# Patient Record
Sex: Female | Born: 1990 | Hispanic: Yes | Marital: Single | Smoking: Never smoker
Health system: Southern US, Community
[De-identification: ages and names within clinical notes are randomized; demographics above are authoritative.]

## PROBLEM LIST (undated history)

## (undated) DIAGNOSIS — Z789 Other specified health status: Secondary | ICD-10-CM

## (undated) DIAGNOSIS — O039 Complete or unspecified spontaneous abortion without complication: Secondary | ICD-10-CM

## (undated) DIAGNOSIS — F32A Depression, unspecified: Secondary | ICD-10-CM

## (undated) HISTORY — DX: Complete or unspecified spontaneous abortion without complication: O03.9

---

## 2021-03-31 ENCOUNTER — Other Ambulatory Visit: Payer: Self-pay

## 2021-03-31 ENCOUNTER — Emergency Department (HOSPITAL_COMMUNITY)
Admission: EM | Admit: 2021-03-31 | Discharge: 2021-03-31 | Disposition: A | Discharge status: Home / Self Care | Payer: Self-pay | Attending: Emergency Medicine | Admitting: Emergency Medicine

## 2021-03-31 ENCOUNTER — Emergency Department (HOSPITAL_COMMUNITY): Payer: Self-pay

## 2021-03-31 DIAGNOSIS — Z3A01 Less than 8 weeks gestation of pregnancy: Secondary | ICD-10-CM | POA: Insufficient documentation

## 2021-03-31 DIAGNOSIS — O26851 Spotting complicating pregnancy, first trimester: Secondary | ICD-10-CM | POA: Insufficient documentation

## 2021-03-31 DIAGNOSIS — N939 Abnormal uterine and vaginal bleeding, unspecified: Secondary | ICD-10-CM

## 2021-03-31 LAB — CBC WITH DIFFERENTIAL/PLATELET
Abs Immature Granulocytes: 0.03 10*3/uL (ref 0.00–0.07)
Basophils Absolute: 0 10*3/uL (ref 0.0–0.1)
Basophils Relative: 0 %
Eosinophils Absolute: 0.1 10*3/uL (ref 0.0–0.5)
Eosinophils Relative: 1 %
HCT: 38.5 % (ref 36.0–46.0)
Hemoglobin: 13 g/dL (ref 12.0–15.0)
Immature Granulocytes: 0 %
Lymphocytes Relative: 24 %
Lymphs Abs: 2.3 10*3/uL (ref 0.7–4.0)
MCH: 31.7 pg (ref 26.0–34.0)
MCHC: 33.8 g/dL (ref 30.0–36.0)
MCV: 93.9 fL (ref 80.0–100.0)
Monocytes Absolute: 0.6 10*3/uL (ref 0.1–1.0)
Monocytes Relative: 6 %
Neutro Abs: 6.4 10*3/uL (ref 1.7–7.7)
Neutrophils Relative %: 69 %
Platelets: 299 10*3/uL (ref 150–400)
RBC: 4.1 MIL/uL (ref 3.87–5.11)
RDW: 12.3 % (ref 11.5–15.5)
WBC: 9.5 10*3/uL (ref 4.0–10.5)
nRBC: 0 % (ref 0.0–0.2)

## 2021-03-31 LAB — ABO/RH: ABO/RH(D): A POS

## 2021-03-31 LAB — HCG, QUANTITATIVE, PREGNANCY: hCG, Beta Chain, Quant, S: 11 m[IU]/mL — ABNORMAL HIGH (ref <5)

## 2021-03-31 LAB — COMPREHENSIVE METABOLIC PANEL
ALT: 13 U/L (ref 0–44)
AST: 15 U/L (ref 15–41)
Albumin: 3.6 g/dL (ref 3.5–5.0)
Alkaline Phosphatase: 51 U/L (ref 38–126)
Anion gap: 6 (ref 5–15)
BUN: 7 mg/dL (ref 6–20)
CO2: 24 mmol/L (ref 22–32)
Calcium: 8.5 mg/dL — ABNORMAL LOW (ref 8.9–10.3)
Chloride: 105 mmol/L (ref 98–111)
Creatinine, Ser: 0.55 mg/dL (ref 0.44–1.00)
GFR, Estimated: >60 mL/min (ref >60)
Glucose, Bld: 92 mg/dL (ref 70–99)
Potassium: 3.6 mmol/L (ref 3.5–5.1)
Sodium: 135 mmol/L (ref 135–145)
Total Bilirubin: 0.6 mg/dL (ref 0.3–1.2)
Total Protein: 6.6 g/dL (ref 6.5–8.1)

## 2021-03-31 LAB — WET PREP, GENITAL
Clue Cells Wet Prep HPF POC: NONE SEEN
Sperm: NONE SEEN
Trich, Wet Prep: NONE SEEN
WBC, Wet Prep HPF POC: NONE SEEN
Yeast Wet Prep HPF POC: NONE SEEN

## 2021-03-31 LAB — POC URINE PREG, ED: Preg Test, Ur: NEGATIVE

## 2021-03-31 NOTE — ED Provider Notes (Signed)
Patient signed out to me by previous provider.  Please refer to their note for full HPI.  Briefly this is a 30 year old female who presents with vaginal bleeding in pregnancy.  Patient's urine pregnancy test is negative but her hCG quant is positive around 11.  Pelvic done by previous provider did show small constant trickle of blood from the cervical os, no tissue.  We are pending ultrasound and OB/GYN consultation.  Patient denies ongoing pelvic pain. Physical Exam  BP 115/68   Pulse 66   Temp 98.9 F (37.2 C) (Oral)   Resp 12   LMP 02/25/2021 (Exact Date)   SpO2 100%   Physical Exam Vitals and nursing note reviewed.  Constitutional:      Appearance: Normal appearance.  HENT:     Head: Normocephalic.     Mouth/Throat:     Mouth: Mucous membranes are moist.  Cardiovascular:     Rate and Rhythm: Normal rate.  Pulmonary:     Effort: Pulmonary effort is normal. No respiratory distress.  Abdominal:     Palpations: Abdomen is soft.     Tenderness: There is no abdominal tenderness.  Skin:    General: Skin is warm.  Neurological:     Mental Status: She is alert and oriented to person, place, and time. Mental status is at baseline.  Psychiatric:        Mood and Affect: Mood normal.    ED Course/Procedures     Procedures  MDM    Ultrasound does not identify an intrauterine gestation.  This could be because the pregnancy is too early, its in an visualized ectopic or miscarriage.  Spoke with on-call OB/GYN Dr. Vergie Living who recommends repeat hCG in the office in 48 hours.  He states his office will call the patient tomorrow to schedule this for Thursday.  Blood type is a positive, no need for RhoGAM.  Results and expectations were discussed thoroughly with interpreter.  Patient continues to have no abdominal/pelvic pain.  Appropriate for outpatient follow-up with OB/GYN.  Patient will be discharged and treated as an outpatient.  Discharge plan and strict return to ED precautions  discussed, patient verbalizes understanding and agreement.      Rozelle Logan, DO 03/31/21 1826

## 2021-03-31 NOTE — ED Triage Notes (Signed)
Pt reports vaginal bleeding this morning with lower back and abdominal pain. Positive home pregnancy test.

## 2021-03-31 NOTE — ED Provider Notes (Signed)
[facility] EMERGENCY DEPARTMENT Provider Note   CSN: [id number] Arrival date & time: 03/31/21  3474     History Chief Complaint  Patient presents with   Vaginal Bleeding    Elaine Douglas is a 30 y.o. female.  The history is provided by the patient. A language interpreter was used.  Vaginal Bleeding Elaine Douglas is a 30 y.o. female who presents to the Emergency Department complaining of bleeding. She presents the emergency department complaining of agile bleeding that started this morning. She states that it was initially very little but it has been increasing during her ED stay. She did have a positive pregnancy test at home eight days ago as well as three days ago. LMP was June 1. She is a G4 [id number]. Has no known medical problems.    No past medical history on file.  There are no problems to display for this patient.      OB History   No obstetric history on file.     No family history on file.     Home Medications Prior to Admission medications   Not on File    Allergies    Patient has no known allergies.  Review of Systems   Review of Systems  Genitourinary:  Positive for vaginal bleeding.  All other systems reviewed and are negative.  Physical Exam Updated Vital Signs BP 115/68   Pulse 66   Temp 98.9 F (37.2 C) (Oral)   Resp 12   LMP 02/25/2021 (Exact Date)   SpO2 100%   Physical Exam Vitals and nursing note reviewed.  Constitutional:      Appearance: She is well-developed.  HENT:     Head: Normocephalic and atraumatic.  Cardiovascular:     Rate and Rhythm: Normal rate and regular rhythm.  Pulmonary:     Effort: Pulmonary effort is normal. No respiratory distress.  Abdominal:     Palpations: Abdomen is soft.     Tenderness: There is no abdominal tenderness. There is no guarding or rebound.  Genitourinary:    Comments: Moderate amount of vaginal bleeding with small clots in vaginal vault.  No CMT.  Only  partially able to visualize os due to retroverted uterus.  No clear FB or tissue in os.  Small amount of recollection of blood after clearing.  Musculoskeletal:        General: No tenderness.  Skin:    General: Skin is warm and dry.  Neurological:     Mental Status: She is alert and oriented to person, place, and time.  Psychiatric:        Behavior: Behavior normal.    ED Results / Procedures / Treatments   Labs (all labs ordered are listed, but only abnormal results are displayed) Labs Reviewed  COMPREHENSIVE METABOLIC PANEL - Abnormal; Notable for the following components:      Result Value   Calcium 8.5 (*)    All other components within normal limits  HCG, QUANTITATIVE, PREGNANCY - Abnormal; Notable for the following components:   hCG, Beta Chain, Quant, S 11 (*)    All other components within normal limits  WET PREP, GENITAL  CBC WITH DIFFERENTIAL/PLATELET  POC URINE PREG, ED  ABO/RH  GC/CHLAMYDIA PROBE AMP ([place]) NOT AT [facility]    EKG None  Radiology No results found.  Procedures Procedures   Medications Ordered in ED Medications - No data to display  ED Course  I have reviewed the triage vital signs and the nursing  notes.  Pertinent labs & imaging results that were available during my care of the patient were reviewed by me and considered in my medical decision making (see chart for details).    MDM Rules/Calculators/A&P                         patient here for evaluation of vaginal bleeding that started today. She had two positive home pregnancy tests over the last week. Her quant is minimally elevated at 11 today. Pelvic examination with small amount of bleeding. Concern for miscarriage. Plan to obtain ultrasound to further evaluate. Patient care transferred pending ultrasound.  Final Clinical Impression(s) / ED Diagnoses Final diagnoses:  Vaginal bleeding    Rx / DC Orders ED Discharge Orders     None        Tilden Fossa, MD 03/31/21  1526

## 2021-03-31 NOTE — Discharge Instructions (Addendum)
You have been seen and discharged from the emergency department.  Your pregnancy hormone was slightly elevated and your blood, this may indicate very early pregnancy.  The ultrasound did not show an intrauterine pregnancy however it is very early on.  This could also be a miscarriage.  The OB/GYN office will call you tomorrow to schedule an appointment on Thursday, 04/02/2021 for repeat blood work and imaging.  It is extremely important that you follow-up with this appointment.  Rest over the next couple days, stay well-hydrated, avoid strenuous activity.  If you have any worsening symptoms, profuse vaginal bleeding, severe lower abdominal/pelvic pain or further concerns for your health please return to an emergency department for further evaluation.

## 2021-03-31 NOTE — ED Notes (Signed)
Patient discharge instructions reviewed with the patient. The patient verbalized understanding of instructions. Patient discharged. 

## 2021-03-31 NOTE — ED Provider Notes (Signed)
Emergency Medicine Provider Triage Evaluation Note  Elaine Douglas 30 y.o. female was evaluated in triage.  Pt complains of vaginal bleeding, abdominal cramping that began today.  Patient reports her LMP was 02/25/2021.  She she has taken at home pregnancy test that were positive.  She states she has had previous miscarriages before.  She reports some associated abdominal cramping.  No fevers.   Review of Systems  Positive: Vaginal bleeding, abdominal cramping. Negative: Fevers.  Physical Exam  BP 134/82   Pulse 70   Temp 98.2 F (36.8 C) (Oral)   Resp 18   Ht 5\' 4"  (1.626 m)   Wt 65.8 kg   SpO2 100%   BMI 24.89 kg/m  Gen:   Awake, no distress   HEENT:  Atraumatic  Resp:  Normal effort  Cardiac:  Normal rate  Abd:   Nondistended, nontender  MSK:   Moves extremities without difficulty  Neuro:  Speech clear   Other:      Medical Decision Making  Medically screening exam initiated at 10:54 AM  Appropriate orders placed.  was informed that the remainder of the evaluation will be completed by another provider, this initial triage assessment does not replace that evaluation. They are counseled that they will need to remain in the ED until the completion of their workup, including full H&P and results of any tests.  Risks of leaving the emergency department prior to completion of treatment were discussed. Patient was advised to inform ED staff if they are leaving before their treatment is complete. The patient acknowledged these risks and time was allowed for questions.     The patient appears stable so that the remainder of the MSE may be completed by another provider.    Clinical Impression  Vaginal bleeding   Portions of this note were generated with Dragon dictation software. Dictation errors may occur despite best attempts at proofreading.     Elaine Squires, PA-C 03/31/21 1055    06/01/21, MD 04/01/21 [id number]

## 2021-03-31 NOTE — ED Notes (Signed)
Patient transported to Ultrasound 

## 2021-04-01 LAB — GC/CHLAMYDIA PROBE AMP (~~LOC~~) NOT AT ARMC
Chlamydia: NEGATIVE
Comment: NEGATIVE
Comment: NORMAL
Neisseria Gonorrhea: NEGATIVE

## 2021-04-03 ENCOUNTER — Telehealth: Payer: Self-pay

## 2021-04-03 DIAGNOSIS — O3680X0 Pregnancy with inconclusive fetal viability, not applicable or unspecified: Secondary | ICD-10-CM

## 2021-04-03 NOTE — Telephone Encounter (Addendum)
-----   Message from Lovilia Bing, MD sent at 04/03/2021 11:41 AM EDT ----- Regarding: please set her up with a non stat beta visit for early next week. thanks  Called pt with [employer] # [phone] and informed her that the provider would like for her to come in next Tuesday for a pregnancy hormone level.    Pt stated that she will be able to come in on 04/07/21 @ 0900 for non stat beta. Office location info provided to the patient.   Leonette Nutting  04/03/21

## 2021-04-07 ENCOUNTER — Other Ambulatory Visit: Payer: Self-pay

## 2021-04-07 ENCOUNTER — Ambulatory Visit: Payer: Self-pay

## 2021-04-07 DIAGNOSIS — O3680X0 Pregnancy with inconclusive fetal viability, not applicable or unspecified: Secondary | ICD-10-CM

## 2021-04-08 ENCOUNTER — Telehealth: Payer: Self-pay

## 2021-04-08 LAB — BETA HCG QUANT (REF LAB): hCG Quant: <1 m[IU]/mL

## 2021-04-08 NOTE — Telephone Encounter (Addendum)
-----   Message from Oktaha Bing, MD sent at 04/08/2021  8:03 AM EDT ----- Can y'all let her know that her beta is back to zero and she should expect a period sometime in the next month? Thanks  Called pt with [employer] and informed pt provider's recommendation and results.  Pt verbalized understanding with no further questions.   Addison Naegeli, RN  04/08/21

## 2021-09-16 ENCOUNTER — Other Ambulatory Visit: Payer: Self-pay

## 2021-09-16 ENCOUNTER — Ambulatory Visit (INDEPENDENT_AMBULATORY_CARE_PROVIDER_SITE_OTHER): Payer: Self-pay | Admitting: Family Medicine

## 2021-09-16 VITALS — BP 116/70 | HR 70 | Wt 162.3 lb

## 2021-09-16 DIAGNOSIS — Z3201 Encounter for pregnancy test, result positive: Secondary | ICD-10-CM

## 2021-09-16 DIAGNOSIS — Z3200 Encounter for pregnancy test, result unknown: Secondary | ICD-10-CM

## 2021-09-16 LAB — POCT PREGNANCY, URINE: Preg Test, Ur: POSITIVE — AB

## 2021-09-16 NOTE — Patient Instructions (Signed)

## 2021-09-16 NOTE — Progress Notes (Signed)
°  History:  [name] is a 30 y.o. G1P0 who presents to clinic today with complaint of possible pregnancy.   Had positive pregnancy test Regular period prior to conceiving No other meds or medical conditions Very nervous due to hx of two losses  History reviewed. No pertinent past medical history.  History reviewed. No pertinent surgical history.  The following portions of the patient's history were reviewed and updated as appropriate: allergies, current medications, past family history, past medical history, past social history, past surgical history and problem list.   Review of Systems:  Pertinent items noted in HPI and remainder of comprehensive ROS otherwise negative.  Objective:  Physical Exam BP 116/70    Pulse 70    Wt 162 lb 4.8 oz (73.6 kg)    LMP 08/14/2021  Physical Exam Vitals reviewed.  Constitutional:      General: She is not in acute distress.    Appearance: She is well-developed. She is not diaphoretic.  Eyes:     General: No scleral icterus. Pulmonary:     Effort: Pulmonary effort is normal. No respiratory distress.  Skin:    General: Skin is warm and dry.  Neurological:     Mental Status: She is alert.     Coordination: Coordination normal.     Labs and Imaging Results for orders placed or performed in visit on 09/16/21 (from the past 24 hour(s))  Pregnancy, urine POC     Status: Abnormal   Collection Time: 09/16/21 10:41 AM  Result Value Ref Range   Preg Test, Ur POSITIVE (A) NEGATIVE    No results found.   Assessment & Plan:  1. Possible pregnancy Congratulated on desired pregnancy Discussed bleeding/ectopic precautions Already taking prenatals Would like to start care with [facility], will schedule   Approximately 15 minutes of total time was spent with this patient on history taking, coordination of care, education and documentation.   Venora Maples, MD 09/16/2021 11:22 AM

## 2021-09-16 NOTE — Progress Notes (Signed)
Possible Pregnancy  Here today for pregnancy confirmation. UPT in office today is positive. Pt reports first positive home UPT on 09/11/21. Reviewed dating with patient:   LMP: 08/14/21 EDD: 05/21/22 4w 5d today  OB history reviewed. Prior history of SAB x 2. Reviewed medications and allergies with patient; list of medications safe to take during pregnancy given.  Crissie Reese, MD to bedside for brief visit.   Marjo Bicker, RN 09/16/2021  10:49 AM

## 2021-10-15 ENCOUNTER — Other Ambulatory Visit: Payer: Self-pay

## 2021-10-15 ENCOUNTER — Inpatient Hospital Stay (HOSPITAL_COMMUNITY)
Admission: AD | Admit: 2021-10-15 | Discharge: 2021-10-15 | Disposition: A | Discharge status: Home / Self Care | Payer: Self-pay | Attending: Obstetrics & Gynecology | Admitting: Obstetrics & Gynecology

## 2021-10-15 ENCOUNTER — Inpatient Hospital Stay (HOSPITAL_COMMUNITY): Payer: Self-pay

## 2021-10-15 ENCOUNTER — Encounter (HOSPITAL_COMMUNITY): Payer: Self-pay | Admitting: Obstetrics & Gynecology

## 2021-10-15 DIAGNOSIS — Z3A08 8 weeks gestation of pregnancy: Secondary | ICD-10-CM | POA: Insufficient documentation

## 2021-10-15 DIAGNOSIS — O209 Hemorrhage in early pregnancy, unspecified: Secondary | ICD-10-CM | POA: Insufficient documentation

## 2021-10-15 DIAGNOSIS — O3680X0 Pregnancy with inconclusive fetal viability, not applicable or unspecified: Secondary | ICD-10-CM | POA: Insufficient documentation

## 2021-10-15 DIAGNOSIS — O200 Threatened abortion: Secondary | ICD-10-CM | POA: Insufficient documentation

## 2021-10-15 LAB — URINALYSIS, ROUTINE W REFLEX MICROSCOPIC
Bilirubin Urine: NEGATIVE
Glucose, UA: NEGATIVE mg/dL
Ketones, ur: NEGATIVE mg/dL
Nitrite: NEGATIVE
Protein, ur: NEGATIVE mg/dL
Specific Gravity, Urine: >1.03 — ABNORMAL HIGH (ref 1.005–1.030)
pH: 6 (ref 5.0–8.0)

## 2021-10-15 LAB — COMPREHENSIVE METABOLIC PANEL
ALT: 12 U/L (ref 0–44)
AST: 13 U/L — ABNORMAL LOW (ref 15–41)
Albumin: 3.8 g/dL (ref 3.5–5.0)
Alkaline Phosphatase: 50 U/L (ref 38–126)
Anion gap: 7 (ref 5–15)
BUN: 9 mg/dL (ref 6–20)
CO2: 24 mmol/L (ref 22–32)
Calcium: 8.7 mg/dL — ABNORMAL LOW (ref 8.9–10.3)
Chloride: 103 mmol/L (ref 98–111)
Creatinine, Ser: 0.71 mg/dL (ref 0.44–1.00)
GFR, Estimated: >60 mL/min (ref >60)
Glucose, Bld: 104 mg/dL — ABNORMAL HIGH (ref 70–99)
Potassium: 3.4 mmol/L — ABNORMAL LOW (ref 3.5–5.1)
Sodium: 134 mmol/L — ABNORMAL LOW (ref 135–145)
Total Bilirubin: 0.4 mg/dL (ref 0.3–1.2)
Total Protein: 6.9 g/dL (ref 6.5–8.1)

## 2021-10-15 LAB — CBC
HCT: 37.4 % (ref 36.0–46.0)
Hemoglobin: 13.1 g/dL (ref 12.0–15.0)
MCH: 32.3 pg (ref 26.0–34.0)
MCHC: 35 g/dL (ref 30.0–36.0)
MCV: 92.1 fL (ref 80.0–100.0)
Platelets: 266 10*3/uL (ref 150–400)
RBC: 4.06 MIL/uL (ref 3.87–5.11)
RDW: 12.7 % (ref 11.5–15.5)
WBC: 10 10*3/uL (ref 4.0–10.5)
nRBC: 0 % (ref 0.0–0.2)

## 2021-10-15 LAB — WET PREP, GENITAL
Clue Cells Wet Prep HPF POC: NONE SEEN
Sperm: NONE SEEN
Trich, Wet Prep: NONE SEEN
WBC, Wet Prep HPF POC: <10 (ref <10)
Yeast Wet Prep HPF POC: NONE SEEN

## 2021-10-15 LAB — URINALYSIS, MICROSCOPIC (REFLEX)

## 2021-10-15 LAB — TYPE AND SCREEN
ABO/RH(D): A POS
Antibody Screen: NEGATIVE

## 2021-10-15 LAB — HCG, QUANTITATIVE, PREGNANCY: hCG, Beta Chain, Quant, S: 21420 m[IU]/mL — ABNORMAL HIGH (ref <5)

## 2021-10-15 NOTE — MAU Provider Note (Signed)
History     [id number]  Arrival date and time: 10/15/21 1522    Chief Complaint  Patient presents with   Vaginal Bleeding     HPI Elaine Douglas is a 31 y.o. at [redacted]w[redacted]d by LMP with PMHx notable for two prior cesareans, who presents for vaginal bleeding.    Reports that earlier today went to bathroom and saw some brownish reddish discharge She is worried about a miscarriage as her last one started like this No abdominal pain No vaginal discharge No burning or pain with urination   --/--/A POS (01/19 1820)  OB History     Gravida  5   Para  2   Term  2   Preterm  0   AB  2   Living         SAB  2   IAB  0   Ectopic  0   Multiple  0   Live Births  2           History reviewed. No pertinent past medical history.  Past Surgical History:  Procedure Laterality Date   CESAREAN SECTION      History reviewed. No pertinent family history.  Social History   Socioeconomic History   Marital status: Single    Spouse name: Not on file   Number of children: Not on file   Years of education: Not on file   Highest education level: Not on file  Occupational History   Not on file  Tobacco Use   Smoking status: Never   Smokeless tobacco: Never  Vaping Use   Vaping Use: Not on file  Substance and Sexual Activity   Alcohol use: Never   Drug use: Never   Sexual activity: Yes    Partners: Male  Other Topics Concern   Not on file  Social History Narrative   Not on file   Social Determinants of Health   Financial Resource Strain: Not on file  Food Insecurity: Not on file  Transportation Needs: Not on file  Physical Activity: Not on file  Stress: Not on file  Social Connections: Not on file  Intimate Partner Violence: Not on file    No Known Allergies  No current facility-administered medications on file prior to encounter.   Current Outpatient Medications on File Prior to Encounter  Medication Sig Dispense Refill   Prenatal  Vit-Fe Fumarate-FA (PRENATAL MULTIVITAMIN) TABS tablet Take 1 tablet by mouth daily at 12 noon.       ROS Pertinent positives and negative per HPI, all others reviewed and negative  Physical Exam   BP 122/62 (BP Location: Right Arm)    Pulse 77    Temp 98.8 F (37.1 C) (Oral)    Resp 16    Wt 73.8 kg    LMP 08/14/2021    SpO2 100% Comment: room air  Patient Vitals for the past 24 hrs:  BP Temp Temp src Pulse Resp SpO2 Weight  10/15/21 1753 122/62 -- -- 77 16 -- --  10/15/21 1548 125/62 98.8 F (37.1 C) Oral 87 16 100 % --  10/15/21 1545 -- -- -- -- -- -- 73.8 kg    Physical Exam Vitals reviewed.  Constitutional:      General: She is not in acute distress.    Appearance: She is well-developed. She is not diaphoretic.  Eyes:     General: No scleral icterus. Pulmonary:     Effort: Pulmonary effort is normal. No respiratory distress.  Abdominal:     General: There is no distension.     Palpations: Abdomen is soft.     Tenderness: There is no abdominal tenderness. There is no guarding or rebound.  Skin:    General: Skin is warm and dry.  Neurological:     Mental Status: She is alert.     Coordination: Coordination normal.     Cervical Exam    Bedside Ultrasound Pt informed that the ultrasound is considered a limited OB ultrasound and is not intended to be a complete ultrasound exam.  Patient also informed that the ultrasound is not being completed with the intent of assessing for fetal or placental anomalies or any pelvic abnormalities.  Explained that the purpose of todays ultrasound is to assess for  viability.  Patient acknowledges the purpose of the exam and the limitations of the study.    My interpretation: IUGS, no yolk sac or other structures   Labs Results for orders placed or performed during the hospital encounter of 10/15/21 (from the past 24 hour(s))  Urinalysis, Routine w reflex microscopic Urine, Clean Catch     Status: Abnormal   Collection Time:  10/15/21  4:03 PM  Result Value Ref Range   Color, Urine YELLOW YELLOW   APPearance CLOUDY (A) CLEAR   Specific Gravity, Urine >1.030 (H) 1.005 - 1.030   pH 6.0 5.0 - 8.0   Glucose, UA NEGATIVE NEGATIVE mg/dL   Hgb urine dipstick LARGE (A) NEGATIVE   Bilirubin Urine NEGATIVE NEGATIVE   Ketones, ur NEGATIVE NEGATIVE mg/dL   Protein, ur NEGATIVE NEGATIVE mg/dL   Nitrite NEGATIVE NEGATIVE   Leukocytes,Ua TRACE (A) NEGATIVE  Urinalysis, Microscopic (reflex)     Status: Abnormal   Collection Time: 10/15/21  4:03 PM  Result Value Ref Range   RBC / HPF 11-20 0 - 5 RBC/hpf   WBC, UA 11-20 0 - 5 WBC/hpf   Bacteria, UA MANY (A) NONE SEEN   Squamous Epithelial / LPF 21-50 0 - 5   Ca Oxalate Crys, UA PRESENT   Wet prep, genital     Status: None   Collection Time: 10/15/21  6:09 PM   Specimen: Cervix  Result Value Ref Range   Yeast Wet Prep HPF POC NONE SEEN NONE SEEN   Trich, Wet Prep NONE SEEN NONE SEEN   Clue Cells Wet Prep HPF POC NONE SEEN NONE SEEN   WBC, Wet Prep HPF POC <10 <10   Sperm NONE SEEN   CBC     Status: None   Collection Time: 10/15/21  6:20 PM  Result Value Ref Range   WBC 10.0 4.0 - 10.5 K/uL   RBC 4.06 3.87 - 5.11 MIL/uL   Hemoglobin 13.1 12.0 - 15.0 g/dL   HCT 59.1 63.8 - 46.6 %   MCV 92.1 80.0 - 100.0 fL   MCH 32.3 26.0 - 34.0 pg   MCHC 35.0 30.0 - 36.0 g/dL   RDW 59.9 35.7 - 01.7 %   Platelets 266 150 - 400 K/uL   nRBC 0.0 0.0 - 0.2 %  Comprehensive metabolic panel     Status: Abnormal   Collection Time: 10/15/21  6:20 PM  Result Value Ref Range   Sodium 134 (L) 135 - 145 mmol/L   Potassium 3.4 (L) 3.5 - 5.1 mmol/L   Chloride 103 98 - 111 mmol/L   CO2 24 22 - 32 mmol/L   Glucose, Bld 104 (H) 70 - 99 mg/dL   BUN 9 6 -  20 mg/dL   Creatinine, Ser 0.71 0.44 - 1.00 mg/dL   Calcium 8.7 (L) 8.9 - 10.3 mg/dL   Total Protein 6.9 6.5 - 8.1 g/dL   Albumin 3.8 3.5 - 5.0 g/dL   AST 13 (L) 15 - 41 U/L   ALT 12 0 - 44 U/L   Alkaline Phosphatase 50 38 - 126 U/L    Total Bilirubin 0.4 0.3 - 1.2 mg/dL   GFR, Estimated >60 >60 mL/min   Anion gap 7 5 - 15  hCG, quantitative, pregnancy     Status: Abnormal   Collection Time: 10/15/21  6:20 PM  Result Value Ref Range   hCG, Beta Chain, Quant, S 21,420 (H) <5 mIU/mL  Type and screen [place]     Status: None   Collection Time: 10/15/21  6:20 PM  Result Value Ref Range   ABO/RH(D) A POS    Antibody Screen NEG    Sample Expiration      10/18/2021,2359 Performed at [facility], [place] [address] [place], [place] [address]     Imaging [place] LESS THAN 14 WEEKS WITH [place] TRANSVAGINAL  Result Date: 10/15/2021 CLINICAL DATA:  Vaginal spotting. EXAM: OBSTETRIC <14 WK [place] AND TRANSVAGINAL OB US TECHNIQUE: Both transabdominal and transvaginal ultrasound examinations were performed for complete evaluation of the gestation as well as the maternal uterus, adnexal regions, and pelvic cul-de-sac. Transvaginal technique was performed to assess early pregnancy. COMPARISON:  None. FINDINGS: Intrauterine gestational sac: Single Yolk sac:  Not Visualized. Embryo:  Not Visualized. Cardiac Activity: Not Visualized. Heart Rate: N/A  bpm MSD: 16.3 mm   6 w   3 d Subchorionic hemorrhage:  Small Maternal uterus/adnexae: The bilateral ovaries are visualized and are normal in appearance. No pelvic free fluid is identified. IMPRESSION: Probable early intrauterine gestational sac, but no yolk sac, fetal pole, or cardiac activity yet visualized. Recommend follow-up quantitative B-HCG levels and follow-up US in 14 days to assess viability. This recommendation follows SRU consensus guidelines: Diagnostic Criteria for Nonviable Pregnancy Early in the First Trimester. [employer] [place] Med [id number]. Electronically Signed   By: Virgina Norfolk M.D.   On: 10/15/2021 19:23    MAU Course  Procedures Lab Orders         Wet prep, genital         Urinalysis, Routine w reflex microscopic Urine, Clean Catch          Urinalysis, Microscopic (reflex)         CBC         Comprehensive metabolic panel         hCG, quantitative, pregnancy    No orders of the defined types were placed in this encounter.  Imaging Orders         US OB LESS THAN 14 WEEKS WITH OB TRANSVAGINAL         US OB LESS THAN 14 WEEKS WITH OB TRANSVAGINAL     MDM moderate  Assessment and Plan  #Vaginal bleeding in pregnancy, first trimester #Threatened miscarriage Wet prep neg. Blood type A+. US shows IUGS with MSD of 16 mm c/f failed pregnancy. Discussed with patient that we will need to get a follow up US in 2 weeks to determine viability. Message sent to Harvey to coordinate.   Dispo: discharged to home in stable condition.   Clarnce Flock, MD/MPH 10/15/21 8:06 PM  Allergies as of 10/15/2021   No Known Allergies  Medication List     TAKE these medications    prenatal multivitamin Tabs tablet Take 1 tablet by mouth daily at 12 noon.

## 2021-10-15 NOTE — MAU Note (Signed)
Elaine Douglas Elaine Douglas is a 31 y.o. at [redacted]w[redacted]d here in MAU reporting: today started seeing some brown spotting and some discharge. No pain.   Onset of complaint: today  Pain score: 0/10  Vitals:   10/15/21 1548  BP: 125/62  Pulse: 87  Resp: 16  Temp: 98.8 F (37.1 C)  SpO2: 100%     Lab orders placed from triage: UA

## 2021-10-16 ENCOUNTER — Telehealth: Payer: Self-pay | Admitting: Lactation Services

## 2021-10-16 LAB — GC/CHLAMYDIA PROBE AMP (~~LOC~~) NOT AT ARMC
Chlamydia: NEGATIVE
Comment: NEGATIVE
Comment: NORMAL
Neisseria Gonorrhea: NEGATIVE

## 2021-10-16 NOTE — Telephone Encounter (Signed)
-----   Message from Venora Maples, MD sent at 10/15/2021  8:08 PM EST ----- Regarding: Needs follow up US Seen in MAU, threatened miscarriage, needs viability scan on 10/29/21 or after, I already placed the order, please call to coordinate for her, she's super anxious about it.  Thanks, [facility]

## 2021-10-16 NOTE — Telephone Encounter (Signed)
Called Radiology to schedule Viability [place] on 2/2 or later. Spoke with Tasha. Scheduled for 2/2 at 9 am at [facility], arrival at 8:45 with full bladder.   Called patient to inform her of appointment date, time and instructions.   Reviewed is she is experiencing severe abdominal pain or bleeding like a period, she is to return to the MAU for evaluation.   Patient voiced understanding with no questions or concerns at this time.

## 2021-10-19 ENCOUNTER — Encounter (HOSPITAL_COMMUNITY): Payer: Self-pay | Admitting: Obstetrics and Gynecology

## 2021-10-19 ENCOUNTER — Inpatient Hospital Stay (HOSPITAL_COMMUNITY)
Admission: AD | Admit: 2021-10-19 | Discharge: 2021-10-19 | Disposition: A | Discharge status: Home / Self Care | Payer: Self-pay | Attending: Family Medicine | Admitting: Family Medicine

## 2021-10-19 ENCOUNTER — Inpatient Hospital Stay (HOSPITAL_COMMUNITY): Payer: Self-pay

## 2021-10-19 DIAGNOSIS — R102 Pelvic and perineal pain: Secondary | ICD-10-CM | POA: Insufficient documentation

## 2021-10-19 DIAGNOSIS — O26891 Other specified pregnancy related conditions, first trimester: Secondary | ICD-10-CM | POA: Insufficient documentation

## 2021-10-19 DIAGNOSIS — O209 Hemorrhage in early pregnancy, unspecified: Secondary | ICD-10-CM

## 2021-10-19 DIAGNOSIS — O021 Missed abortion: Secondary | ICD-10-CM | POA: Insufficient documentation

## 2021-10-19 DIAGNOSIS — Z3A09 9 weeks gestation of pregnancy: Secondary | ICD-10-CM | POA: Insufficient documentation

## 2021-10-19 DIAGNOSIS — R109 Unspecified abdominal pain: Secondary | ICD-10-CM | POA: Insufficient documentation

## 2021-10-19 LAB — CBC
HCT: 39.3 % (ref 36.0–46.0)
Hemoglobin: 13.7 g/dL (ref 12.0–15.0)
MCH: 32.2 pg (ref 26.0–34.0)
MCHC: 34.9 g/dL (ref 30.0–36.0)
MCV: 92.5 fL (ref 80.0–100.0)
Platelets: 244 10*3/uL (ref 150–400)
RBC: 4.25 MIL/uL (ref 3.87–5.11)
RDW: 12.6 % (ref 11.5–15.5)
WBC: 9 10*3/uL (ref 4.0–10.5)
nRBC: 0 % (ref 0.0–0.2)

## 2021-10-19 LAB — HCG, QUANTITATIVE, PREGNANCY: hCG, Beta Chain, Quant, S: 15560 m[IU]/mL — ABNORMAL HIGH (ref <5)

## 2021-10-19 MED ORDER — MISOPROSTOL 200 MCG PO TABS: ORAL_TABLET | ORAL | 1 refills | Status: DC

## 2021-10-19 MED ORDER — OXYCODONE HCL 5 MG PO TABS
5.0000 mg | ORAL_TABLET | Freq: Once | ORAL | Status: AC
  Administered 2021-10-19: 5 mg via ORAL
  Filled 2021-10-19: qty 1

## 2021-10-19 MED ORDER — OXYCODONE-ACETAMINOPHEN 5-325 MG PO TABS: 1.0000 | ORAL_TABLET | Freq: Four times a day (QID) | ORAL | 0 refills | Status: DC | PRN

## 2021-10-19 NOTE — MAU Note (Signed)
Patient was seen in MAU a few days ago and returns with continued abdominal pain  and increased vaginal bleeding with clots.  Passing some small to medium blood clots.  Rates pain 7/10.
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
## 2021-10-21 ENCOUNTER — Telehealth: Payer: Self-pay

## 2021-10-21 NOTE — Telephone Encounter (Signed)
Called Pt to start New OB Intake twice, no answer, left VM.

## 2021-10-27 ENCOUNTER — Telehealth: Payer: Self-pay

## 2021-10-29 ENCOUNTER — Other Ambulatory Visit (HOSPITAL_COMMUNITY): Payer: Self-pay | Admitting: Family Medicine

## 2021-10-29 ENCOUNTER — Ambulatory Visit
Admission: RE | Admit: 2021-10-29 | Discharge: 2021-10-29 | Disposition: A | Discharge status: Home / Self Care | Payer: Self-pay | Source: Ambulatory Visit | Attending: Family Medicine | Admitting: Family Medicine

## 2021-10-29 ENCOUNTER — Other Ambulatory Visit: Payer: Self-pay

## 2021-10-29 ENCOUNTER — Ambulatory Visit (INDEPENDENT_AMBULATORY_CARE_PROVIDER_SITE_OTHER): Payer: Self-pay

## 2021-10-29 DIAGNOSIS — O209 Hemorrhage in early pregnancy, unspecified: Secondary | ICD-10-CM

## 2021-10-29 DIAGNOSIS — O200 Threatened abortion: Secondary | ICD-10-CM

## 2021-10-29 DIAGNOSIS — O039 Complete or unspecified spontaneous abortion without complication: Secondary | ICD-10-CM

## 2021-10-29 NOTE — Progress Notes (Signed)
° °  GYNECOLOGY PROGRESS NOTE  History:  31 y.o. [id number] presents to [facility] office today for follow up ultrasound for SAB. Patient is s/p cytotec on 1/23 for SAB. She reports bleeding small amount of dark red blood since, having to change pads only 2-3 times/day, as well as mild intermittent cramping.  The following portions of the patient's history were reviewed and updated as appropriate: allergies, current medications, past family history, past medical history, past social history, past surgical history and problem list.   Health Maintenance Due  Topic Date Due   HIV Screening  Never done   Hepatitis C Screening  Never done   TETANUS/TDAP  Never done   PAP SMEAR-Modifier  Never done     Review of Systems:  Pertinent items are noted in HPI.   Objective:  Physical Exam Last menstrual period 08/14/2021. VS reviewed, nursing note reviewed,  Constitutional: well developed, well nourished, no distress HEENT: normocephalic CV: normal rate Pulm/chest wall: normal effort Breast Exam: deferred Abdomen: soft Neuro: alert and oriented x 3 Skin: warm, dry Psych: affect normal Pelvic exam: deferred  Assessment & Plan:  1. SAB (spontaneous abortion) - Reviewed ultrasound from today with Dr. Rip Harbour. Concern for ?POC vs hematoma - Repeat bHCG today. Will have patient f/u in 2 weeks for repeat bHCG and appointment with MD - Eda present for interpretation  - Beta hCG quant (ref lab)   Renee Harder, CNM

## 2021-10-29 NOTE — Progress Notes (Signed)
Pt states having vaginal bleeding since last week and still today. Changing pad every 2-3 times a day. Intermittent small clots with mild pain in lower abd. Took Cytotec on 10/19/21. Today's ultrasound was for confirmation of all POC cleared since SAB.   Elaine Douglas

## 2021-10-30 LAB — BETA HCG QUANT (REF LAB): hCG Quant: 246 m[IU]/mL

## 2021-11-02 ENCOUNTER — Ambulatory Visit: Payer: Self-pay | Admitting: Family Medicine

## 2021-11-13 ENCOUNTER — Other Ambulatory Visit: Payer: Self-pay

## 2021-11-13 DIAGNOSIS — O039 Complete or unspecified spontaneous abortion without complication: Secondary | ICD-10-CM

## 2021-11-14 LAB — BETA HCG QUANT (REF LAB): hCG Quant: 3 m[IU]/mL

## 2021-11-16 ENCOUNTER — Ambulatory Visit (INDEPENDENT_AMBULATORY_CARE_PROVIDER_SITE_OTHER): Payer: Self-pay | Admitting: Obstetrics and Gynecology

## 2021-11-16 ENCOUNTER — Encounter: Payer: Self-pay | Admitting: Obstetrics and Gynecology

## 2021-11-16 ENCOUNTER — Other Ambulatory Visit: Payer: Self-pay

## 2021-11-16 DIAGNOSIS — Z30011 Encounter for initial prescription of contraceptive pills: Secondary | ICD-10-CM

## 2021-11-16 DIAGNOSIS — O039 Complete or unspecified spontaneous abortion without complication: Secondary | ICD-10-CM

## 2021-11-16 DIAGNOSIS — Z309 Encounter for contraceptive management, unspecified: Secondary | ICD-10-CM | POA: Insufficient documentation

## 2021-11-16 HISTORY — DX: Complete or unspecified spontaneous abortion without complication: O03.9

## 2021-11-16 MED ORDER — DESOGESTREL-ETHINYL ESTRADIOL 0.15-30 MG-MCG PO TABS
1.0000 | ORAL_TABLET | Freq: Every day | ORAL | 11 refills | Status: DC
Start: 2021-11-16 — End: 2022-03-22

## 2021-11-16 NOTE — Patient Instructions (Signed)

## 2021-11-16 NOTE — Progress Notes (Signed)
Ms Marcial Pacas presents for follow up SAB. Second SAB with current partner One SAB in the past with different partner 2 term c section with prior partner Last BHCG < 3. Some spotting Denies any bowel or bladder dysfunction Needs pap Desires OCP's.  PE AF VSS Lungs clear Heart RRR Abd soft + BS GU deffered Ext non tender  A/P SAB        Contraception         HM  Reviewed SAB with pt.  Work up after 3. Contraception reviewed with pt. Desires OCP's reviewed with PT/R/B/Back up method discussed. To start today. Refer to [facility] for pap smear. F/U PRN

## 2021-11-25 NOTE — Progress Notes (Signed)
?Subjective:  ? ? Elaine Douglas - 31 y.o. female MRN [id number]  Date of birth: 1991/06/22 ? ?HPI ? ?Elaine Douglas is to establish care.  ? ?Current issues and/or concerns: ?SPONTANEOUS ABORTION FOLLOW-UP: ?CONTRACEPTIVE PILLS FOLLOW-UP: ?11/16/2021 at Center for [facility] per MD note: ?Ms Elaine Douglas presents for follow up SAB. ?Second SAB with current partner ?One SAB in the past with different partner ?2 term c section with prior partner ?Last BHCG < 3. Some spotting ?Denies any bowel or bladder dysfunction ?Needs pap ?Desires OCP's. ?Reviewed SAB with pt.  Work up after 3. Contraception reviewed with pt. Desires OCP's reviewed with PT/R/B/Back up method discussed. To start today. ?Refer to [facility] for pap smear. ?F/U PRN           ? ?12/04/2021: ?Requesting second opinion referral to Obstetrics/Gynecology. Recently seen at [facility] following spontaneous abortion. Reports was told to wait at least 6 months before trying to have another baby. Prescribed birth control pills at that time. She does not want to take birth control pills even though she understands the reason why they were prescribed. Concern because she has history of 3 total spontaneous abortions. She has two older children ages 30 years-old and 43 years-old. Reports had one spontaneous abortion with her older children's father. Currently has a new partner and had two spontaneous abortions with the same. ? ? ? ?ROS per HPI  ? ? ? ?Health Maintenance:  ?Health Maintenance Due  ?Topic Date Due  ? HIV Screening  Never done  ? Hepatitis C Screening  Never done  ? TETANUS/TDAP  Never done  ? PAP SMEAR-Modifier  Never done  ? ? ? ?Past Medical History: ?Patient Active Problem List  ? Diagnosis Date Noted  ? SAB (spontaneous abortion) 11/16/2021  ? Contraception management 11/16/2021  ? ? ?Social History  ? reports that she has never smoked. She has been exposed to  tobacco smoke. She has never used smokeless tobacco. She reports that she does not drink alcohol and does not use drugs.  ? ?Family History  ?family history is not on file.  ? ?Medications: reviewed and updated ?  ?Objective:  ? Physical Exam ?BP 116/75 (BP Location: Left Arm, Patient Position: Sitting, Cuff Size: Normal)   Pulse 70   Temp 98.5 ?F (36.9 ?C)   Resp 18   Ht 5' 2.6" (1.59 m)   Wt 158 lb (71.7 kg)   LMP 08/14/2021   SpO2 99%   BMI 28.35 kg/m?  ? ?Physical Exam ?HENT:  ?   Head: Normocephalic and atraumatic.  ?Eyes:  ?   Extraocular Movements: Extraocular movements intact.  ?   Conjunctiva/sclera: Conjunctivae normal.  ?   Pupils: Pupils are equal, round, and reactive to light.  ?Cardiovascular:  ?   Rate and Rhythm: Normal rate and regular rhythm.  ?   Pulses: Normal pulses.  ?   Heart sounds: Normal heart sounds.  ?Pulmonary:  ?   Effort: Pulmonary effort is normal.  ?   Breath sounds: Normal breath sounds.  ?Musculoskeletal:  ?   Cervical back: Normal range of motion and neck supple.  ?Neurological:  ?   General: No focal deficit present.  ?   Mental Status: She is alert and oriented to person, place, and time.  ?Psychiatric:     ?   Mood and Affect: Mood normal.     ?   Behavior: Behavior normal.  ? ?   ?  Assessment & Plan:  ?1. Encounter to establish care: ?- Patient presents today to establish care.  ?- Return for annual physical examination, labs, and health maintenance. Arrive fasting meaning having no food for at least 8 hours prior to appointment. You may have only water or black coffee. Please take scheduled medications as normal. ? ?2. History of miscarriage: ?- Referral to Obstetrics / Gynecology for second opinion evaluation and management.  ?- Ambulatory referral to Obstetrics / Gynecology ? ?3. Language barrier: ?- [place] in-person interpreter, Byrd Hesselbach, participated during today's appointment.  ? ? ? ? ?Patient was given clear instructions to go to Emergency Department or return  to medical center if symptoms don't improve, worsen, or new problems develop.The patient verbalized understanding. ? ?I discussed the assessment and treatment plan with the patient. The patient was provided an opportunity to ask questions and all were answered. The patient agreed with the plan and demonstrated an understanding of the instructions. ?  ?The patient was advised to call back or seek an in-person evaluation if the symptoms worsen or if the condition fails to improve as anticipated. ? ? ? ?Ricky Stabs, NP ?12/04/2021, 8:10 PM ?Primary Care at [facility]  ? ?

## 2021-12-04 ENCOUNTER — Ambulatory Visit (INDEPENDENT_AMBULATORY_CARE_PROVIDER_SITE_OTHER): Payer: Self-pay | Admitting: Family

## 2021-12-04 ENCOUNTER — Encounter: Payer: Self-pay | Admitting: Family

## 2021-12-04 ENCOUNTER — Other Ambulatory Visit: Payer: Self-pay

## 2021-12-04 VITALS — BP 116/75 | HR 70 | Temp 98.5°F | Resp 18 | Ht 62.6 in | Wt 158.0 lb

## 2021-12-04 DIAGNOSIS — Z8759 Personal history of other complications of pregnancy, childbirth and the puerperium: Secondary | ICD-10-CM

## 2021-12-04 DIAGNOSIS — Z789 Other specified health status: Secondary | ICD-10-CM

## 2021-12-04 DIAGNOSIS — Z7689 Persons encountering health services in other specified circumstances: Secondary | ICD-10-CM

## 2021-12-04 NOTE — Patient Instructions (Signed)
Thank you for choosing Primary Care at [facility] for your medical home!   ? ?Elaine Douglas was seen by Rema Fendt, NP today.  ? ?Dairl Ponder Aburto's primary care provider is Rema Fendt, NP.   ?For the best care possible,  you should try to see Ricky Stabs, NP ?whenever you come to clinic.  ? ?We look forward to seeing you again soon! ? ?If you have any questions about your visit today,  ?please call us at [phone] ? ?Or feel free to reach your provider via MyChart.   ? ?Keeping you healthy ?  ?Get these tests ?Blood pressure- Have your blood pressure checked once a year by your healthcare provider.  Normal blood pressure is 120/80. ?Weight- Have your body mass index (BMI) calculated to screen for obesity.  BMI is a measure of body fat based on height and weight. You can also calculate your own BMI at https://www.west-esparza.com/. ?Cholesterol- Have your cholesterol checked regularly starting at age 39, sooner may be necessary if you have diabetes, high blood pressure, if a family member developed heart diseases at an early age or if you smoke.  ?Chlamydia, HIV, and other sexual transmitted disease- Get screened each year until the age of 74 then within three months of each new sexual partner. ?Diabetes- Have your blood sugar checked regularly if you have high blood pressure, high cholesterol, a family history of diabetes or if you are overweight. ?  ?Get these vaccines ?Flu shot- Every fall. ?Tetanus shot- Every 10 years. ?Menactra- Single dose; prevents meningitis. ?  ?Take these steps ?Don't smoke- If you do smoke, ask your healthcare provider about quitting. For tips on how to quit, go to www.smokefree.gov or call 1-800-QUIT-NOW. ?Be physically active- Exercise 5 days a week for at least 30 minutes.  If you are not already physically active start slow and gradually work up to 30 minutes of moderate physical activity.  Examples of moderate activity include walking briskly,  mowing the yard, dancing, swimming bicycling, etc. ?Eat a healthy diet- Eat a variety of healthy foods such as fruits, vegetables, low fat milk, low fat cheese, yogurt, lean meats, poultry, fish, beans, tofu, etc.  For more information on healthy eating, go to www.thenutritionsource.org ?Drink alcohol in moderation- Limit alcohol intake two drinks or less a day.  Never drink and drive. ?Dentist- Brush and floss teeth twice daily; visit your dentis twice a year. ?Depression-Your emotional health is as important as your physical health.  If you're feeling down, losing interest in things you normally enjoy please talk with your healthcare provider. ?Arboriculturist- If you keep a gun in your home, keep it unloaded and with the safety lock on.  Bullets should be stored separately. ?Helmet use- Always wear a helmet when riding a motorcycle, bicycle, rollerblading or skateboarding. ?Safe sex- If you may be exposed to a sexually transmitted infection, use a condom ?Seat belts- Seat bels can save your life; always wear one. ?Smoke/Carbon Monoxide detectors- These detectors need to be installed on the appropriate level of your home.  Replace batteries at least once a year. ?Skin Cancer- When out in the sun, cover up and use sunscreen SPF 15 or higher. ?Violence- If anyone is threatening or hurting you, please tell your healthcare provider. ? ?

## 2021-12-04 NOTE — Progress Notes (Signed)
Pt presents to establish care ?Pt request referral to Gyn due to 3 recent miscarriages  ?

## 2021-12-16 ENCOUNTER — Ambulatory Visit (INDEPENDENT_AMBULATORY_CARE_PROVIDER_SITE_OTHER): Payer: Self-pay | Admitting: Primary Care

## 2021-12-25 NOTE — Progress Notes (Signed)
? ? ?Patient ID: Elaine Douglas, female    DOB: 10-06-1990  MRN: [id number] ? ?CC: Gynecology Referral Follow-Up ? ?Subjective: ?Elaine Douglas is a 31 y.o. female who presents for gynecology referral follow-up.  ? ?Her concerns today include:  ?GYNECOLOGY REFERRAL FOLLOW-UP: ?12/04/2021 [facility] [place]: ?Requesting second opinion referral to Obstetrics/Gynecology. Recently seen at [facility] following spontaneous abortion. Reports was told to wait at least 6 months before trying to have another baby. Prescribed birth control pills at that time. She does not want to take birth control pills even though she understands the reason why they were prescribed. Concern because she has history of 3 total spontaneous abortions. She has two older children ages 34 years-old and 60 years-old. Reports had one spontaneous abortion with her older children's father. Currently has a new partner and had two spontaneous abortions with the same. ?  ?12/28/2021 Primary [facility] [place]: ?Reports does not want to return to [facility]. Reports she had bad several bad experiences  while receiving care there. States "The doctors screwed up."  ? ?Patient reports she had 3 miscarriages while under their care. When she became pregnant most recently being the third time she was concerned that she would miscarry as she had done previously x 2. At the time of her third pregnancy she was 4 weeks. Reports a doctor there told her that she had nothing to worry about. Subsequently she miscarried for the third time when she was [redacted] weeks pregnant.  ? ?Shortly after she was seen at a nearby Urgent Care. Reports she was seen by the same doctor that worked at [facility]. Reports she felt he would do something differently but he didn't. Instead reports doctor told her that if she has 3 miscarriages by the same partner they both need additional  screenings.  ? ?After most recent appointment at [facility] reports she received a call from updated referral [facility] for [facility] Initially missed their call. Reports when she called them back there wasn't an interpreter and states "I had to use the little English that I had to communicate". Reports because they did not understand her she kept getting translated to  [facility] and she didn't understand why because she doesn't want to be seen there.  ? ? ?Patient Active Problem List  ? Diagnosis Date Noted  ? SAB (spontaneous abortion) 11/16/2021  ? Contraception management 11/16/2021  ?  ? ?Current Outpatient Medications on File Prior to Visit  ?Medication Sig Dispense Refill  ? desogestrel-ethinyl estradiol (APRI) 0.15-30 MG-MCG tablet Take 1 tablet by mouth daily. 28 tablet 11  ? ?No current facility-administered medications on file prior to visit.  ? ? ?No Known Allergies ? ?Social History  ? ?Socioeconomic History  ? Marital status: Single  ?  Spouse name: Not on file  ? Number of children: Not on file  ? Years of education: Not on file  ? Highest education level: Not on file  ?Occupational History  ? Not on file  ?Tobacco Use  ? Smoking status: Never  ?  Passive exposure: Past  ? Smokeless tobacco: Never  ?Vaping Use  ? Vaping Use: Not on file  ?Substance and Sexual Activity  ? Alcohol use: Never  ? Drug use: Never  ? Sexual activity: Yes  ?  Partners: Male  ?Other Topics Concern  ? Not on file  ?Social History  Narrative  ? Not on file  ? ?Social Determinants of Health  ? ?Financial Resource Strain: Not on file  ?Food Insecurity: No Food Insecurity  ? Worried About Programme researcher, broadcasting/film/video in the Last Year: Never true  ? Ran Out of Food in the Last Year: Never true  ?Transportation Needs: No Transportation Needs  ? Lack of Transportation (Medical): No  ? Lack of Transportation (Non-Medical): No  ?Physical Activity: Not on file  ?Stress: Not on file  ?Social  Connections: Not on file  ?Intimate Partner Violence: Not on file  ? ? ?No family history on file. ? ?Past Surgical History:  ?Procedure Laterality Date  ? CESAREAN SECTION    ? ? ?ROS: ?Review of Systems ?Negative except as stated above ? ?PHYSICAL EXAM: ?BP 109/71   Pulse 75   Resp 18   LMP 08/14/2021   SpO2 98%  ? ? ?Physical Exam ?HENT:  ?   Head: Normocephalic and atraumatic.  ?Eyes:  ?   Extraocular Movements: Extraocular movements intact.  ?   Conjunctiva/sclera: Conjunctivae normal.  ?   Pupils: Pupils are equal, round, and reactive to light.  ?Cardiovascular:  ?   Rate and Rhythm: Normal rate and regular rhythm.  ?   Pulses: Normal pulses.  ?   Heart sounds: Normal heart sounds.  ?Pulmonary:  ?   Effort: Pulmonary effort is normal.  ?   Breath sounds: Normal breath sounds.  ?Musculoskeletal:  ?   Cervical back: Normal range of motion and neck supple.  ?Neurological:  ?   General: No focal deficit present.  ?   Mental Status: She is alert and oriented to person, place, and time.  ?Psychiatric:     ?   Mood and Affect: Mood normal.     ?   Behavior: Behavior normal.  ? ? ?ASSESSMENT AND PLAN: ?1. History of miscarriage: ?- As requested Margorie John, CMA called Center for [facility] to find out more details as to why patient was unable to be scheduled at the same. We were able to find out that because patient has [employer] and previously established with [facility] they are unable to schedule patient for that reason. It was further explained that Center for [facility] at [employer] [facility] are within the same affiliate. ?- Patient plans to call [facility] to schedule appointment soon. Reports she plans to ask for a female provider.  ?- Follow-up with primary provider as needed. ? ?2. Language barrier: ?- [employer]  participated during today's appointment. Interpreter Name: Marchelle Folks, ID#: [id number]. ? ?Patient was given the opportunity to ask questions.  Patient verbalized understanding of the plan and was able to repeat key elements of the plan. Patient was given clear instructions to go to Emergency Department or return to medical center if symptoms don't improve, worsen, or new problems develop.The patient verbalized understanding. ? ?Requested Prescriptions  ? ? No prescriptions requested or ordered in this encounter  ? ? ?Follow-up with primary provider as scheduled.  ? ?Rema Fendt, NP  ?

## 2021-12-28 ENCOUNTER — Ambulatory Visit (INDEPENDENT_AMBULATORY_CARE_PROVIDER_SITE_OTHER): Payer: Self-pay | Admitting: Family

## 2021-12-28 VITALS — BP 109/71 | HR 75 | Resp 18

## 2021-12-28 DIAGNOSIS — Z789 Other specified health status: Secondary | ICD-10-CM

## 2021-12-28 DIAGNOSIS — Z8759 Personal history of other complications of pregnancy, childbirth and the puerperium: Secondary | ICD-10-CM

## 2021-12-28 NOTE — Progress Notes (Signed)
Pt request new referral to gyn pt did not want to go to [facility] stated that she had already been there  ?[facility] contacted per Lorelee Market will not be able to schedule there because already established with [facility]) ?

## 2022-01-19 ENCOUNTER — Other Ambulatory Visit: Payer: Self-pay | Admitting: *Deleted

## 2022-01-19 DIAGNOSIS — Z124 Encounter for screening for malignant neoplasm of cervix: Secondary | ICD-10-CM

## 2022-01-19 NOTE — Progress Notes (Signed)
Patient: Elaine Douglas           ?Date of Birth: 1991-05-03           ?MRN: [id number] ?Visit Date: 01/19/2022 ?PCP: Rema Fendt, NP ? ?Cervical Cancer Screening ?Do you smoke?: No ?Have you ever had or been told you have an allergy to latex products?: No ?Marital status: Single ?Date of last pap smear: More than 5 yrs ago (10/15/2011-Negative ([facility])) ?Date of last menstrual period: 01/11/22 ?Number of pregnancies: 5 ?Number of births: 2 ?Have you ever had any of the following? ?Hysterectomy: No ?Tubal ligation (tubes tied): No ?Abnormal bleeding: No ?Abnormal pap smear: No ?Venereal warts: No ?A sex partner with venereal warts: No ?A high risk* sex partner: No ? ?Cervical Exam ? ?Abnormal Observations: Cervix friable. ?Recommendations: Last Pap smear was 10/15/2011 at [facility] and normal per patient. Per patient has no history of an abnormal Pap smear. Last Pap smear result is located in [facility] in [place]. Let patient know if today's Pap smear is normal and HPV negative that her next Pap smear is due in 5 years. Informed patient that will follow up with her within the next couple of weeks with results of her Pap smear by phone. Patient verbalized understanding. ? ? ?Spanish interpreter Natale Lay from [facility] provided. ? ?Patient's History ?Patient Active Problem List  ? Diagnosis Date Noted  ? SAB (spontaneous abortion) 11/16/2021  ? Contraception management 11/16/2021  ? ?Past Medical History:  ?Diagnosis Date  ? Miscarriage   ?  ?No family history on file.  ?Social History  ? ?Occupational History  ? Not on file  ?Tobacco Use  ? Smoking status: Never  ?  Passive exposure: Past  ? Smokeless tobacco: Never  ?Vaping Use  ? Vaping Use: Not on file  ?Substance and Sexual Activity  ? Alcohol use: Never  ? Drug use: Never  ? Sexual activity: Yes  ?  Partners: Male  ? ?

## 2022-01-22 LAB — CYTOLOGY - PAP
Comment: NEGATIVE
Diagnosis: NEGATIVE
High risk HPV: NEGATIVE

## 2022-01-25 ENCOUNTER — Telehealth: Payer: Self-pay

## 2022-01-25 NOTE — Telephone Encounter (Signed)
Via Julie Sowell, Spanish Interpreter (Seba Dalkai), Patient informed negative Pap/HPV results, next pap due in 5 years. Patient verbalized understanding.  

## 2022-03-01 ENCOUNTER — Ambulatory Visit: Payer: Self-pay | Admitting: Obstetrics & Gynecology

## 2022-03-04 ENCOUNTER — Ambulatory Visit: Payer: Self-pay | Admitting: Student

## 2022-03-22 ENCOUNTER — Encounter: Payer: Self-pay | Admitting: Family Medicine

## 2022-03-22 ENCOUNTER — Other Ambulatory Visit: Payer: Self-pay

## 2022-03-22 ENCOUNTER — Ambulatory Visit (INDEPENDENT_AMBULATORY_CARE_PROVIDER_SITE_OTHER): Payer: Self-pay | Admitting: Family Medicine

## 2022-03-22 VITALS — BP 114/66 | HR 71 | Ht 64.0 in | Wt 153.0 lb

## 2022-03-22 DIAGNOSIS — N96 Recurrent pregnancy loss: Secondary | ICD-10-CM

## 2022-03-22 NOTE — Progress Notes (Signed)
GYNECOLOGY OFFICE VISIT NOTE  History:   Elaine Douglas is a 31 y.o. 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 She reports 3 previous losses <[redacted] weeks gestation over the past several years. She has two children via C/S aged 40 and 63 years old, no miscarriages prior to this.   Approximately 3 years ago, around 8-10 weeks. With previous partner, FOB of her children.  July 2022: Early. Had positive pregnancy test at home for >1 week and then bleeding. She went to the ED after with B-Hcg down to 11. 48 hours later down to <5. Occurred with current partner.  09/2021: Seen in the MAU with missed abortion at [redacted]w[redacted]d (MSD measuring ~ 6 weeks). Given cytotec then. Occurred with current partner.   She has been with her current partner for 1 year and would like children. She has been on OCPs since April because she does not want to go through another miscarriage again. She requests further evaluation into why this keeps happening. She has felt frustrated that this hasn't happened yet. No other medications. Denies any significant past medical history. No STIs. Denies any chronic pelvic pain, cycle irregularities, unexpected weight loss or gain, heat/cold intolerance, or excessive facial hair growth.   Non-smoker. Denies illicit drug use. Social occasional alcohol.   Spanish interpreter via Ipad used for duration of visit.     Past Medical History:  Diagnosis Date   Miscarriage     Past Surgical History:  Procedure Laterality Date   CESAREAN SECTION      The following portions of the patient's history were reviewed and updated as appropriate: allergies, current medications, past family history, past medical history, past social history, past surgical history and problem list.   Health Maintenance:  Normal pap in 12/2021.   Review of Systems:  Pertinent items noted in HPI and remainder of comprehensive ROS otherwise negative.  Physical Exam:  BP 114/66   Pulse 71    Ht 5\' 4"  (1.626 m)   Wt 153 lb (69.4 kg)   LMP 03/10/2022 (Exact Date)   Breastfeeding No   BMI 26.26 kg/m   CONSTITUTIONAL: Well-developed, well-nourished female in no acute distress.  HEENT:  Normocephalic, atraumatic.  NECK: Normal range of motion, supple, no masses noted on observation SKIN: No rash noted. Not diaphoretic. No erythema. No pallor. MUSCULOSKELETAL: Normal range of motion. No edema noted. NEUROLOGIC: Alert and oriented to person, place, and time.  PSYCHIATRIC: Normal mood and affect. Normal behavior. Normal judgment and thought content. CARDIOVASCULAR: Normal heart rate noted RESPIRATORY: Effort WNL ABDOMEN: No other overt distention noted.   PELVIC: Deferred  Assessment and Plan:   1. History of recurrent miscarriages Discussed with patient that miscarriage can be quite common with 1/5 women experiencing it in their lifetime. 1/25 (4%) for second, and 1/125 (<1%) for a third.  Reviewed the most common reason is presumed to be genetic abnormalities that allow a pregnancy to start but not continue past an early stage, but realistically we do not know the cause in most cases. Additionally discussed that we can definitely proceed with a workup at this point but may not discover any abnormality or reason.    --She is self pay currently. Initially planned to start with transvaginal saline 08-23-2004, however after discussion with Dr. [place] will discuss plan w/ pt as follows after researching cost of lab work:   --A1c, TSH, anti-phospholipid panel  -- if any recurrent pregnancy loss, should be offered anora for fetal genetic  testing  -- Start Prometrium 200mg  vaginally at start of pregnancy once she decides to attempt conceiving off OCPs. Could also considering starting ASA as well.   --Will discuss applying for [facility] financial assistance    Return after labs with a female (pt preference) Ob/gyn MD.   , DO  OB Fellow, [facility]  [facility] 03/25/2022 9:51 AM

## 2022-03-25 ENCOUNTER — Encounter: Payer: Self-pay | Admitting: Family Medicine

## 2022-04-01 ENCOUNTER — Telehealth: Payer: Self-pay | Admitting: Family Medicine

## 2022-04-01 NOTE — Telephone Encounter (Signed)
Called patient to discuss obtaining labs vs [place]. Working on figuring out cost currently.   No answer, left voicemail with spanish interpreter.   Allayne Stack, DO

## 2022-04-02 ENCOUNTER — Telehealth: Payer: Self-pay | Admitting: Family Medicine

## 2022-04-02 NOTE — Telephone Encounter (Signed)
Called patient again to discuss change in plan. No answer and left voicemail. Called partner as well, but no answer and no voicemail set up.   Allayne Stack, DO

## 2022-04-04 ENCOUNTER — Telehealth: Payer: Self-pay | Admitting: Family Medicine

## 2022-04-04 NOTE — Telephone Encounter (Signed)
Called again. No answer after calling twice with spanish interpreter.   Allayne Stack, DO

## 2022-04-05 ENCOUNTER — Encounter: Payer: Self-pay | Admitting: Advanced Practice Midwife

## 2022-04-05 ENCOUNTER — Ambulatory Visit
Admission: RE | Admit: 2022-04-05 | Discharge: 2022-04-05 | Disposition: A | Discharge status: Home / Self Care | Payer: Self-pay | Source: Ambulatory Visit | Attending: Family Medicine | Admitting: Family Medicine

## 2022-04-05 ENCOUNTER — Ambulatory Visit (INDEPENDENT_AMBULATORY_CARE_PROVIDER_SITE_OTHER): Payer: Self-pay | Admitting: Advanced Practice Midwife

## 2022-04-05 VITALS — BP 111/60 | HR 71 | Wt 155.0 lb

## 2022-04-05 DIAGNOSIS — N96 Recurrent pregnancy loss: Secondary | ICD-10-CM

## 2022-04-05 NOTE — Progress Notes (Signed)
   Established Patient Office Visit  Subjective   Patient ID: Elaine Douglas, female    DOB: [birth year]  Age: 31 y.o. 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 See prior note for details. Denies known thyroid Dz, DM. IS generally healthy.  Patient's last menstrual period was 03/10/2022 (exact date).  ROS: Neg for vaginal bleeding.     Objective:     BP 111/60   Pulse 71   Wt 155 lb (70.3 kg)   LMP 03/10/2022 (Exact Date)   BMI 26.61 kg/m      Physical Exam Constitutional:      General: She is not in acute distress.    Appearance: Normal appearance. She is normal weight.  Cardiovascular:     Rate and Rhythm: Normal rate.  Pulmonary:     Effort: Pulmonary effort is normal.  Neurological:     Mental Status: She is alert.   Gyn [place]  CLINICAL DATA:  Recurrent miscarriages   EXAM: TRANSABDOMINAL AND TRANSVAGINAL ULTRASOUND OF PELVIS   TECHNIQUE: Both transabdominal and transvaginal ultrasound examinations of the pelvis were performed. Transabdominal technique was performed for global imaging of the pelvis including uterus, ovaries, adnexal regions, and pelvic cul-de-sac. It was necessary to proceed with endovaginal exam following the transabdominal exam to visualize the endometrium and adnexa.   COMPARISON:  Pelvic ultrasound 10/29/2021   FINDINGS: Uterus   Measurements: 8.5 x 4.6 x 6.1 cm = volume: 123 mL. Inhomogeneous echogenicity of the myometrium with no focal mass visualized.   Endometrium   Thickness: 3 mm.  No focal abnormality visualized.   Right ovary   Measurements: 2.3 x 1.8 x 1.9 cm = volume: 4 mL. Normal appearance/no adnexal mass.   Left ovary   Measurements: 2.8 x 1.3 x 2 cm = volume: 4 mL. Normal appearance. Adjacent 7 mm anechoic paraovarian cyst.   Other findings   No  abnormal free fluid.   IMPRESSION: 1. No acute process or suspicious mass identified. 2. Tiny left paraovarian cyst     Electronically Signed   By: Jannifer Hick M.D.   On: 04/05/2022 09:10    Assessment & Plan:   1. History of recurrent miscarriages - Contact info given for Dr. April Manson, REI specialist - TSH - Hemoglobin A1c    Dorathy Kinsman, CNM

## 2022-04-06 LAB — HEMOGLOBIN A1C
Est. average glucose Bld gHb Est-mCnc: 100 mg/dL
Hgb A1c MFr Bld: 5.1 % (ref 4.8–5.6)

## 2022-04-06 LAB — TSH: TSH: 1.16 u[IU]/mL (ref 0.450–4.500)

## 2022-04-07 ENCOUNTER — Telehealth: Payer: Self-pay | Admitting: Advanced Practice Midwife

## 2022-04-07 NOTE — Telephone Encounter (Signed)
Called to with live interpreter to give lab results. Informed her that these screening tests for Diabetes and Thyroid problems were negative and therefor not likely a factor in her miscarriages. Verbalized understanding. Instructed to F/U with Dr. April Manson if desired.

## 2022-11-25 ENCOUNTER — Inpatient Hospital Stay (HOSPITAL_COMMUNITY): Payer: Self-pay

## 2022-11-25 ENCOUNTER — Inpatient Hospital Stay (HOSPITAL_COMMUNITY)
Admission: AD | Admit: 2022-11-25 | Discharge: 2022-11-25 | Disposition: A | Discharge status: Home / Self Care | Payer: Self-pay | Attending: Obstetrics & Gynecology | Admitting: Obstetrics & Gynecology

## 2022-11-25 ENCOUNTER — Encounter (HOSPITAL_COMMUNITY): Payer: Self-pay | Admitting: *Deleted

## 2022-11-25 DIAGNOSIS — N8311 Corpus luteum cyst of right ovary: Secondary | ICD-10-CM | POA: Insufficient documentation

## 2022-11-25 DIAGNOSIS — O3481 Maternal care for other abnormalities of pelvic organs, first trimester: Secondary | ICD-10-CM | POA: Insufficient documentation

## 2022-11-25 DIAGNOSIS — O418X10 Other specified disorders of amniotic fluid and membranes, first trimester, not applicable or unspecified: Secondary | ICD-10-CM

## 2022-11-25 DIAGNOSIS — Z3A08 8 weeks gestation of pregnancy: Secondary | ICD-10-CM | POA: Insufficient documentation

## 2022-11-25 DIAGNOSIS — O208 Other hemorrhage in early pregnancy: Secondary | ICD-10-CM | POA: Insufficient documentation

## 2022-11-25 DIAGNOSIS — O209 Hemorrhage in early pregnancy, unspecified: Secondary | ICD-10-CM

## 2022-11-25 HISTORY — DX: Depression, unspecified: F32.A

## 2022-11-25 HISTORY — DX: Other specified health status: Z78.9

## 2022-11-25 LAB — POCT PREGNANCY, URINE: Preg Test, Ur: POSITIVE — AB

## 2022-11-25 LAB — CBC
HCT: 36.2 % (ref 36.0–46.0)
Hemoglobin: 12.6 g/dL (ref 12.0–15.0)
MCH: 32.1 pg (ref 26.0–34.0)
MCHC: 34.8 g/dL (ref 30.0–36.0)
MCV: 92.1 fL (ref 80.0–100.0)
Platelets: 302 10*3/uL (ref 150–400)
RBC: 3.93 MIL/uL (ref 3.87–5.11)
RDW: 12.8 % (ref 11.5–15.5)
WBC: 11.7 10*3/uL — ABNORMAL HIGH (ref 4.0–10.5)
nRBC: 0 % (ref 0.0–0.2)

## 2022-11-25 LAB — WET PREP, GENITAL
Clue Cells Wet Prep HPF POC: NONE SEEN
Sperm: NONE SEEN
Trich, Wet Prep: NONE SEEN
WBC, Wet Prep HPF POC: <10 (ref <10)
Yeast Wet Prep HPF POC: NONE SEEN

## 2022-11-25 LAB — HIV ANTIBODY (ROUTINE TESTING W REFLEX): HIV Screen 4th Generation wRfx: NONREACTIVE

## 2022-11-25 LAB — HCG, QUANTITATIVE, PREGNANCY: hCG, Beta Chain, Quant, S: 215973 m[IU]/mL — ABNORMAL HIGH (ref <5)

## 2022-11-25 NOTE — MAU Provider Note (Signed)
History     CSN: [id number]  Arrival date and time: 11/25/22 [id number]   Event Date/Time   First Provider Initiated Contact with Patient 11/25/22 0805      Chief Complaint  Patient presents with   Vaginal Bleeding   Possible Pregnancy   [name] is a 32 y.o. year old [phone] female at 34w0dweeks gestation by LMP who presents to MAU reporting (+) HPT a month ago. She reports she started having vaginal spotting after SI on 11/19/2022. She reports it stopped, but started back again this morning with also passing 2 blood clots this morning. She denies any pain. She has a h/o SAB x 3. She has not been seen for this pregnancy or had the pregnancy confirmed anywhere.    OB History     Gravida  6   Para  2   Term  2   Preterm  0   AB  3   Living  2      SAB  3   IAB  0   Ectopic  0   Multiple  0   Live Births  2        Obstetric Comments  1st oligo  2nd rpt         Past Medical History:  Diagnosis Date   Depression    seeing therapist   Medical history non-contributory    Miscarriage     Past Surgical History:  Procedure Laterality Date   CESAREAN SECTION      History reviewed. No pertinent family history.  Social History   Tobacco Use   Smoking status: Never    Passive exposure: Past   Smokeless tobacco: Never  Vaping Use   Vaping Use: Never used  Substance Use Topics   Alcohol use: Never   Drug use: Never    Allergies: No Known Allergies  Medications Prior to Admission  Medication Sig Dispense Refill Last Dose   folic acid (FOLVITE) 4A999333MCG tablet Take 400 mcg by mouth daily.   11/24/2022   Prenatal Vit-Fe Fumarate-FA (MULTIVITAMIN-PRENATAL) 27-0.8 MG TABS tablet Take 1 tablet by mouth daily at 12 noon.   11/24/2022    Review of Systems  Constitutional: Negative.   HENT: Negative.    Eyes: Negative.   Respiratory: Negative.    Cardiovascular: Negative.   Gastrointestinal: Negative.   Endocrine: Negative.    Genitourinary:  Positive for vaginal bleeding (passed 2 clots this morning).  Musculoskeletal: Negative.   Skin: Negative.   Allergic/Immunologic: Negative.   Neurological: Negative.   Hematological: Negative.   Psychiatric/Behavioral: Negative.     Physical Exam   Blood pressure (!) 114/53, pulse 86, temperature 98.3 F (36.8 C), temperature source Oral, resp. rate 16, height '5\' 3"'$  (1.6 m), weight 72.4 kg, last menstrual period 09/30/2022, SpO2 100 %.  Physical Exam Vitals and nursing note reviewed. Exam conducted with a chaperone present.  Constitutional:      Appearance: Normal appearance. She is normal weight.  Cardiovascular:     Rate and Rhythm: Normal rate.  Pulmonary:     Effort: Pulmonary effort is normal.  Abdominal:     General: Abdomen is flat.     Palpations: Abdomen is soft.  Genitourinary:    General: Normal vulva.     Comments: Pelvic exam: External genitalia normal, SE: vaginal walls pink and well rugated, cervix is smooth, pink, no lesions, scant amt of clear vaginal d/c with one small clot coming out of cervical os--  WP, GC/CT done, cervix visually closed, Uterus is non-tender, no CMT or friability, no adnexal tenderness.  Musculoskeletal:        General: Normal range of motion.  Skin:    General: Skin is warm and dry.  Neurological:     Mental Status: She is alert and oriented to person, place, and time.  Psychiatric:        Mood and Affect: Mood normal.        Behavior: Behavior normal.        Thought Content: Thought content normal.        Judgment: Judgment normal.    MAU Course  Procedures  MDM CCUA UPT CBC ABO/Rh -- not drawn; known A POS HCG Wet Prep GC/CT -- pending HIV -- pending OB < 14 wks [place] with TV  Results for orders placed or performed during the hospital encounter of 11/25/22 (from the past 24 hour(s))  Pregnancy, urine POC     Status: Abnormal   Collection Time: 11/25/22  7:53 AM  Result Value Ref Range   Preg Test, Ur  POSITIVE (A) NEGATIVE  CBC     Status: Abnormal   Collection Time: 11/25/22  8:13 AM  Result Value Ref Range   WBC 11.7 (H) 4.0 - 10.5 K/uL   RBC 3.93 3.87 - 5.11 MIL/uL   Hemoglobin 12.6 12.0 - 15.0 g/dL   HCT 36.2 36.0 - 46.0 %   MCV 92.1 80.0 - 100.0 fL   MCH 32.1 26.0 - 34.0 pg   MCHC 34.8 30.0 - 36.0 g/dL   RDW 12.8 11.5 - 15.5 %   Platelets 302 150 - 400 K/uL   nRBC 0.0 0.0 - 0.2 %  Wet prep, genital     Status: None   Collection Time: 11/25/22  8:28 AM   Specimen: Vaginal  Result Value Ref Range   Yeast Wet Prep HPF POC NONE SEEN NONE SEEN   Trich, Wet Prep NONE SEEN NONE SEEN   Clue Cells Wet Prep HPF POC NONE SEEN NONE SEEN   WBC, Wet Prep HPF POC <10 <10   Sperm NONE SEEN     US OB Comp Less 14 Wks  Result Date: 11/25/2022 CLINICAL DATA:  Vaginal bleeding in first trimester of pregnancy EXAM: OBSTETRIC <14 WK ULTRASOUND TECHNIQUE: Transabdominal ultrasound was performed for evaluation of the gestation as well as the maternal uterus and adnexal regions. COMPARISON:  None Available. FINDINGS: Intrauterine gestational sac: Present, single Yolk sac:  Present Embryo:  Present Cardiac Activity: Present Heart Rate: 157 bpm CRL:   16.2 mm   8 w 0 d                  [place] EDC: 07/07/2023 Subchorionic hemorrhage:  Very small subchronic hemorrhage Maternal uterus/adnexae: Probable small chorionic bump. Maternal uterus otherwise unremarkable. Small corpus luteal cyst RIGHT ovary, ovaries otherwise normal appearance. No free pelvic fluid or adnexal masses. IMPRESSION: Single live intrauterine gestation at 8 weeks 0 days EGA by crown-rump length. Probable small chorionic bump, which could reflect hemorrhage or failed second embryo. Electronically Signed   By: Lavonia Dana M.D.   On: 11/25/2022 08:59    Assessment and Plan  1. Vaginal bleeding affecting early pregnancy - Information provided on vaginal bleeding - Return to MAU: If you have heavier bleeding that soaks through more that 2 pads  per hour for an hour or more If you bleed so much that you feel like you might pass out or you  do pass out If you have significant abdominal pain that is not improved with Tylenol 1000 mg every 8 hours as needed for pain If you develop a fever > 100.5    2. Subchorionic hematoma in first trimester, single or unspecified fetus - Information provided on [facility] and threatened miscarriage   3. [place] Video [place], [place] used history-taking and assessment  - [employer] Video Spanish Interpreter, Lizbeth Bark [phone] used for explanation of results and plan of care  4. [redacted] weeks gestation of pregnancy  - Discharge patient - Schedule an appt with [facility] in 1 month to establish [facility] -- msg sent to [place] to get her scheduled - Patient verbalized an understanding of the plan of care and agrees.   Laury Deep, CNM 11/25/2022, 8:05 AM

## 2022-11-25 NOTE — MAU Note (Addendum)
Elaine Douglas Elaine Douglas is a 32 y.o. at Unknown here in MAU reporting: +HPT a month ago.  Last Friday had some spotting, only that day.   This morning had bleeding, passed 2 clots, one in the toilet, one in her underwear.  Has not been seen any where or had the preg confirmed.  Reports hx of SAB x3.  Denies any pain. LMP: 1/4 Onset of complaint: wk ago Pain score: none Vitals:   11/25/22 0738  BP: (!) 114/53  Pulse: 86  Resp: 16  Temp: 98.3 F (36.8 C)  SpO2: 100%     Lab orders placed from triage:  UPT

## 2022-11-25 NOTE — Discharge Instructions (Addendum)
Regresar a MAU:  Si tiene un sangrado ms abundante que empapa ms de 2 toallas sanitarias por hora durante una hora o ms  Si sangra tanto que siente que podra desmayarse o se desmaya  Si tiene dolor abdominal significativo que no mejora con Tylenol 1000 mg cada 8 horas segn sea necesario para Conservation officer, historic buildings.  Si presenta fiebre > 100,5

## 2022-11-26 LAB — GC/CHLAMYDIA PROBE AMP (~~LOC~~) NOT AT ARMC
Chlamydia: NEGATIVE
Comment: NEGATIVE
Comment: NORMAL
Neisseria Gonorrhea: NEGATIVE

## 2022-12-01 ENCOUNTER — Inpatient Hospital Stay (HOSPITAL_COMMUNITY): Payer: Self-pay

## 2022-12-01 ENCOUNTER — Inpatient Hospital Stay (HOSPITAL_COMMUNITY)
Admission: AD | Admit: 2022-12-01 | Discharge: 2022-12-01 | Disposition: A | Discharge status: Home / Self Care | Payer: Self-pay | Attending: Obstetrics and Gynecology | Admitting: Obstetrics and Gynecology

## 2022-12-01 ENCOUNTER — Other Ambulatory Visit: Payer: Self-pay

## 2022-12-01 DIAGNOSIS — O99611 Diseases of the digestive system complicating pregnancy, first trimester: Secondary | ICD-10-CM | POA: Insufficient documentation

## 2022-12-01 DIAGNOSIS — O26851 Spotting complicating pregnancy, first trimester: Secondary | ICD-10-CM | POA: Insufficient documentation

## 2022-12-01 DIAGNOSIS — O209 Hemorrhage in early pregnancy, unspecified: Secondary | ICD-10-CM

## 2022-12-01 DIAGNOSIS — K5900 Constipation, unspecified: Secondary | ICD-10-CM | POA: Insufficient documentation

## 2022-12-01 DIAGNOSIS — Z3A08 8 weeks gestation of pregnancy: Secondary | ICD-10-CM | POA: Insufficient documentation

## 2022-12-01 NOTE — Discharge Instructions (Signed)
Safe Medications in Pregnancy   Acne: Benzoyl Peroxide Salicylic Acid  Backache/Headache: Tylenol: 2 regular strength every 4 hours OR              2 Extra strength every 6 hours  Colds/Coughs/Allergies: Benadryl (alcohol free) 25 mg every 6 hours as needed Breath right strips Claritin Cepacol throat lozenges Chloraseptic throat spray Cold-Eeze- up to three times per day Cough drops, alcohol free Flonase (by prescription only) Guaifenesin Mucinex Robitussin DM (plain only, alcohol free) Saline nasal spray/drops Sudafed (pseudoephedrine) & Actifed ** use only after [redacted] weeks gestation and if you do not have high blood pressure Tylenol Vicks Vaporub Zinc lozenges Zyrtec   Constipation: Colace Ducolax suppositories Fleet enema Glycerin suppositories Metamucil Milk of magnesia Miralax Senokot Smooth move tea  Diarrhea: Kaopectate Imodium A-D  *NO pepto Bismol  Hemorrhoids: Anusol Anusol HC Preparation H Tucks  Indigestion: Tums Maalox Mylanta Zantac  Pepcid  Insomnia: Benadryl (alcohol free) '25mg'$  every 6 hours as needed Tylenol PM Unisom, no Gelcaps  Leg Cramps: Tums MagGel  Nausea/Vomiting:  Bonine Dramamine Emetrol Ginger extract Sea bands Meclizine  Nausea medication to take during pregnancy:  Unisom (doxylamine succinate 25 mg tablets) Take one tablet daily at bedtime. If symptoms are not adequately controlled, the dose can be increased to a maximum recommended dose of two tablets daily (1/2 tablet in the morning, 1/2 tablet mid-afternoon and one at bedtime). Vitamin B6 '100mg'$  tablets. Take one tablet twice a day (up to 200 mg per day).  Skin Rashes: Aveeno products Benadryl cream or '25mg'$  every 6 hours as needed Calamine Lotion 1% cortisone cream  Yeast infection: Gyne-lotrimin 7 Monistat 7  Gum/tooth pain: Anbesol  **If taking multiple medications, please check labels to avoid duplicating the same active ingredients **take  medication as directed on the label ** Do not exceed 4000 mg of tylenol in 24 hours **Do not take medications that contain aspirin or ibuprofen

## 2022-12-01 NOTE — MAU Note (Signed)
Elaine Douglas Elaine Douglas is a 32 y.o. at 42w6dhere in MAU reporting: VB that began last week.  Reports VB isn't much, is with wiping only.   LMP: NA Onset of complaint: last week Pain score: 0 Vitals:   12/01/22 0809  BP: (!) 102/44  Pulse: 79  Resp: 18  Temp: 98.7 F (37.1 C)  SpO2: 100%     FHT:NA Lab orders placed from triage:   None

## 2022-12-01 NOTE — MAU Provider Note (Signed)
History     CSN: [id number]  Arrival date and time: 12/01/22 0744   Event Date/Time   First Provider Initiated Contact with Patient 12/01/22 [phone]      Chief Complaint  Patient presents with   Vaginal Bleeding    Elaine Douglas is a 32 y.o. [id number] at [id number] [facility] She reports her next appt is April 4th. She presents today for vaginal bleeding.  She was seen, last week, for the same issue.  She states initially the bleeding was bright red, but is now brown in color.  She states this change is also brought about a decreased amount of discharge.  She denies abdominal pain or recent sexual activity.    OB History     Gravida  6   Para  2   Term  2   Preterm  0   AB  3   Living  2      SAB  3   IAB  0   Ectopic  0   Multiple  0   Live Births  2        Obstetric Comments  1st oligo  2nd rpt         Past Medical History:  Diagnosis Date   Depression    seeing therapist   Medical history non-contributory    Miscarriage     Past Surgical History:  Procedure Laterality Date   CESAREAN SECTION      No family history on file.  Social History   Tobacco Use   Smoking status: Never    Passive exposure: Past   Smokeless tobacco: Never  Vaping Use   Vaping Use: Never used  Substance Use Topics   Alcohol use: Never   Drug use: Never    Allergies: No Known Allergies  Medications Prior to Admission  Medication Sig Dispense Refill Last Dose   folic acid (FOLVITE) 4A999333MCG tablet Take 400 mcg by mouth daily.      Prenatal Vit-Fe Fumarate-FA (MULTIVITAMIN-PRENATAL) 27-0.8 MG TABS tablet Take 1 tablet by mouth daily at 12 noon.       Review of Systems  Gastrointestinal:  Positive for constipation. Negative for abdominal pain, diarrhea, nausea and vomiting.  Genitourinary:  Positive for vaginal bleeding. Negative for difficulty urinating, dysuria and vaginal discharge.   Physical Exam   Blood pressure (!) 102/44,  pulse 79, temperature 98.7 F (37.1 C), temperature source Oral, resp. rate 18, height '5\' 3"'$  (1.6 m), weight 72.3 kg, last menstrual period 09/30/2022, SpO2 100 %.  Physical Exam Constitutional:      General: She is not in acute distress.    Appearance: Normal appearance.  HENT:     Head: Normocephalic and atraumatic.  Eyes:     Conjunctiva/sclera: Conjunctivae normal.  Cardiovascular:     Rate and Rhythm: Normal rate.  Pulmonary:     Effort: Pulmonary effort is normal. No respiratory distress.  Musculoskeletal:        General: Normal range of motion.     Cervical back: Normal range of motion.  Skin:    General: Skin is dry.  Neurological:     Mental Status: She is alert and oriented to person, place, and time.  Psychiatric:        Mood and Affect: Mood normal.        Behavior: Behavior normal.     MAU Course  Procedures [place] Comp Less 14 Wks  Result Date: 12/01/2022 CLINICAL DATA:  Vaginal bleeding. Eight weeks and 6 days pregnant by previous ultrasound. EXAM: OBSTETRIC <14 WK ULTRASOUND TECHNIQUE: Transabdominal ultrasound was performed for evaluation of the gestation as well as the maternal uterus and adnexal regions. COMPARISON:  11/25/2022 FINDINGS: Intrauterine gestational sac: Visualized Yolk sac:  Visualized Embryo:  Visualized Cardiac Activity: Visualized Heart Rate: 168 bpm CRL:   18.2 mm   8 w 2 d                  [place] EDC: 07/11/2023 Subchorionic hemorrhage:  Small Maternal uterus/adnexae: Normal appearing ovaries with a right ovarian corpus luteum noted. The previously noted probable small chorionic bump is unchanged. IMPRESSION: 1. Single live intrauterine gestation with an estimated gestational age by today's measurements of 8 weeks and 2 days. 2. Small subchorionic hemorrhage. 3. Stable probable chorionic bump. Electronically Signed   By: Claudie Revering M.D.   On: 12/01/2022 08:47    MDM Ultrasound Assessment and Plan  32 year old, [id number]  SIUP at 8.6 weeks Vaginal  Bleeding Constipation  -Imaging ordered while patient in triage.  -Results return as above. -Provider to bedside to discuss. -Informed of known [facility] and reviewed what to expect including bleeding, risks for miscarriage, and resolution.  -Encouraged pelvic rest until complete cessation of bleeding.  -Reviewed usage of OTC medications for constipation as well as increased fiber and water intake. -Precautions reviewed. -Instructed to keep next appt as scheduled.  -Encouraged to call primary office or return to MAU if symptoms worsen or with the onset of new symptoms. -Discharged to home in stable condition.   Elaine Douglas 12/01/2022, 9:48 AM

## 2022-12-30 ENCOUNTER — Other Ambulatory Visit: Payer: Self-pay

## 2022-12-30 ENCOUNTER — Ambulatory Visit (INDEPENDENT_AMBULATORY_CARE_PROVIDER_SITE_OTHER): Payer: Self-pay

## 2022-12-30 ENCOUNTER — Other Ambulatory Visit (HOSPITAL_COMMUNITY)
Admission: RE | Admit: 2022-12-30 | Discharge: 2022-12-30 | Disposition: A | Discharge status: Home / Self Care | Payer: Self-pay | Source: Ambulatory Visit | Attending: Family Medicine | Admitting: Family Medicine

## 2022-12-30 VITALS — BP 124/59 | HR 79 | Wt 164.6 lb

## 2022-12-30 DIAGNOSIS — Z3480 Encounter for supervision of other normal pregnancy, unspecified trimester: Secondary | ICD-10-CM

## 2022-12-30 DIAGNOSIS — O0992 Supervision of high risk pregnancy, unspecified, second trimester: Secondary | ICD-10-CM | POA: Insufficient documentation

## 2022-12-30 NOTE — Progress Notes (Signed)
New OB Intake  I connected with Elaine Douglas  on 12/30/22 at  3:15 PM EDT by In Person Visit and verified that I am speaking with the correct person using two identifiers. Nurse is located at [facility] and pt is located at [facility].  I discussed the limitations, risks, security and privacy concerns of performing an evaluation and management service by telephone and the availability of in person appointments. I also discussed with the patient that there may be a patient responsible charge related to this service. The patient expressed understanding and agreed to proceed.  I explained I am completing New OB Intake today. We discussed EDD of 07/07/23 that is based on LMP of 09/30/22. Pt is G6/P2. I reviewed her allergies, medications, Medical/Surgical/OB history, and appropriate screenings. I informed her of [facility] services. [facility] information placed in AVS. Based on history, this is a low risk pregnancy.  Patient Active Problem List   Diagnosis Date Noted   Supervision of other normal pregnancy, antepartum 12/30/2022   SAB (spontaneous abortion) 11/16/2021   Contraception management 11/16/2021    Concerns addressed today  Delivery Plans Plans to deliver at [facility]. Patient given information for [facility] Healthy Baby website for more information about [facility] and [place]. Patient is not interested in water birth. Offered upcoming OB visit with CNM to discuss further.  MyChart/Babyscripts MyChart access verified. I explained pt will have some visits in office and some virtually. Babyscripts instructions given and order placed. Patient verifies receipt of registration text/e-mail. Account successfully created and app downloaded.  Blood Pressure Cuff/Weight Scale Patient is self-pay; explained patient will be given BP cuff at first prenatal appt. Explained after first prenatal appt pt will check weekly and document in 56. Patient does have weight scale.  Anatomy US Explained  first scheduled [place] will be around 19 weeks. Anatomy US scheduled for 02/10/23 at 0100p. Pt notified to arrive at 1245p.  Labs Discussed Johnsie Cancel genetic screening with patient. Would like both Panorama and Horizon drawn at new OB visit. Routine prenatal labs needed.  COVID Vaccine Patient has had COVID vaccine.   Is patient a CenteringPregnancy candidate?  No Centering for her due date    Is patient a Mom+Baby Combined Care candidate?  Not a candidate    Social Determinants of Health Food Insecurity: Patient denies food insecurity. WIC Referral: Patient is interested in referral to [facility].  Transportation: Patient denies transportation needs. Childcare: Discussed no children allowed at ultrasound appointments. Offered childcare services; patient declines childcare services at this time.  Interested in [place]? If yes, send referral and doula dot phrase.   First visit review I reviewed new OB appt with patient. Explained pt will be seen by Dr.Newton at first visit; encounter routed to appropriate provider. Explained that patient will be seen by pregnancy navigator following visit with provider.   [name], CMA 12/30/2022  4:21 PM

## 2022-12-30 NOTE — Patient Instructions (Signed)
[place] [facility] Providers  [place] ([address]) [facility] Brown, MD; Chambliss, MD; Eniola, MD; Hensel, MD; McDiarmid, MD; McIntyer, MD [address] [place], [place] [address] [phone] Mon-Fri 8:30-12:30, 1:30-5:00  Providers come to see babies during newborn hospitalization Only accepting infants of Mother's who are seen at Family Medicine Center or have siblings seen at   [facility] Center Medicaid - Yes; Tricare - Yes   [facility] Mulberry, MD [address] [place], [place] [address] [phone] Mon, Tue, Thur, Fri 8:30-5:00, Wed 10:00-7:00 (closed 1-2pm daily for lunch) Takes [place] residents with no insurance.  [place] [facility] only with Medicaid/insurance; Tricare - no  [facility] Ben-Davies, MD; Brown, MD; Chandler, MD; Ettefagh, MD; Grant, MD; Hanvey, MD; Herrin, MD; Jones,  MD; Lester, MD; McCormick, MD; McQueen, MD; Simha, MD; Stanley, MD; Stryffeler, NP [address], [place], [place] [address] [phone] Mon, Tue, Thur, Fri 8:30-5:30, Wed 9:30-5:30, Sat 8:30-12:30 Only accepting infants of first-time parents or siblings of current patients Hospital discharge coordinator will make follow-up appointment Medicaid - yes; Tricare - yes  [place] ([address]) [facility] Cox, MD; Davis, MD; Dovico, MD; Ettefaugh, MD; Lowe, MD; Nation, MD; Slimp, MD; Sumner, MD; Williams, MD [address], [place], [place] [address] [phone] Mon-Fri 8:30-5:00, closed for lunch 12:30-1:30; Sat-Sun 10:00-1:00 Accepting Newborns with commercial insurance only, must call prior to delivery to be accepted into  practice.  Medicaid - no, Tricare - yes   [facility] [address] [place], [place] [address] [phone] or [phone] Mon to Fri 8am to 10pm, Sat 8am to 1pm  (virtual only on weekends) Only accepts Medicaid Healthy Blue pts  [facility]  Artis, MD; Coccaro, MD; Lockett Gardner, MD; Netherton, NP; Roper, MD; Wilmot, PA-C; Skinner, MD [address] [place], [place] [address] [phone] Mon-Fri 8:30-5:30 Medicaid - yes, Tricare - yes  [place] ([address]) [facility] Warner, MD [address], [place], [place] [address] [phone] Mon, Tues, Wed Fri 8:30-5:00, Sat 8:30-12:00, Closed Thursdays Accepting siblings of established patients and first time mom's if you call prenatally Medicaid- yes; Tricare - yes  [facility] Becker, PA; Hagler, MD; Quinn, PA-C; Scifres, PA; Sun, MD; Swayne, MD;  [address], [place], [place] [address] [phone] Mon-Fri 8:30-5:00, closed for lunch 1-2 Only accepting newborns of established patients Medicaid- no; Tricare - yes  [place] ([address]) [facility] Timberlake, MD; [address], [place], [place] [address] [phone] Mon-Fri 8:00-5:00 Medicaid - No; Tricare - Yes  [facility] [employer]  Brake, NP; Wharton, PA [address], [place], [place] [address] [phone] Mon-Fri 8:00-5:00 Medicaid - No, Tricare - Yes  [facility] Gay, MD; Quinlan, MD; Blatt, DNP [address] [place], [place] [address] [phone]  Mon-Fri 8:00-5:00 Medicaid - No; Tricare - Yes  [facility] [address] [place], [place] [address] [phone] Mon-Fri 8:30-5:00 (lunch 12:00-1:00) Medicaid -Yes; Tricare - Yes  [facility] Jordan, MD [address], [place], [place] [address] [phone] Mon-Fri 8:00-5:00 Seeing newborns of current patients only. No new patients Medicaid - No, Tricare - yes  [facility] Parker, MD [address] [place], [place] [address] [phone] Mon-Fri 8:00-5:00 Medicaid -yes as secondary coverage only;   Tricare - yes  [facility] Brecken, PA; Christy, NP; Dees, MD; DeClaire, MD; DeWeese, MD; Hodge, PA; Smoot, NP; Summer, MD; Vapne, MD [address] [place], [place] [address] [phone] Mon-Fri 8:30-5:00, Sat 9:00-11:00 Accepts commercial insurance ONLY. Offers free prenatal information sessions for families. Medicaid - No, Tricare - Call first  [facility] Bouska, MD; Gordon, PA; Jeffery, PA; Weber, PA [address] [place] [address] [phone] Mon-Fri 7:30-5:30 Medicaid - Yes; Tricare - yes  [place] ([address] & [address])  [facility] Reese, MD [address] [place], [place] [address] [phone] Mon-Thur 8:00-6:00, closed for lunch 12-2, closed Fridays Medicaid - yes; Tricare - no  [facility] Anderson, NP; Badger, MD; Beal, PA; Spencer, PA [address], [place], [place] [address] [phone] Mon-Fri 7:30-4:30 Medicaid - yes, Tricare - yes  [facility]  Agbuya, MD; Klett, NP; Romgoolam, MD; Rothstein, NP [address], [place], [place] [address] [phone] Mon-Fri 8:30-5:00, closed for lunch 1-2, Sat 8:30-12:00 - sick visits only Providers come to see babies at [facility] Only accepting newborns of siblings and first time parents ONLY if who have met with office prior to delivery Medicaid -Yes; Tricare - yes  [facility]  Golden, DO; Friddle, NP; Wallace, MD; Wood, MD:  [address], [place], [place] [address] [phone] Mon- Fri 8:00-5:00, Sat 9:00-12:00 - sick visits only Accepting siblings of established patients and first time mom/baby Medicaid - Yes; Tricare - yes Patients must have vaccinations (baby vaccines)  [place] ([address] &  [address])  [facility] [facility] [address] [place], [place] [address] [phone] Mon-Fri 8:00-5:00 Medicaid - no; Tricare - yes  [facility] Briscoe, MD; Schmidt, PA; Moreira, PA [address], [place], [place] [address] [phone] Mon-Fri 8:00-5:00 Medicaid- yes; Tricare - yes  [facility] Boyd, MD; Jones, NP; Osborn, PA [address], [place], [place] [address] [phone] Mon-Fri 8:00-5:00 Medicaid - Yes; Tricare - yes  [place] ([address])  [facility] Atkinson, PA; Calderon, PA; Cummings, MD; Dillard, MD; Henrish, NP; Isenhour, DO; Martin, PA; Olson, MD; Ott, MD; Phillips, MD; Valente, PA; VanDeven, PA; Yonjof, NP [address] [place] [address], [place], [place] [address] [phone] Mon-Fri 8:30-5:00, Sat 9:00-12:00 - sick only Please register online [email or web address] then schedule online or call office Medicaid-Yes; Tricare -yes  [facility]  Dabrusco, MD; Dial, MD; [place], MD; Fleenor, NP; Goolsby, PA; Tonuzi, MD; Turner, NP; West, MD [address], [place], [place] [address] [phone] Mon-Fri 8:00-5:30, Sat&Sun by appointment (phones open at 8:30) Medicaid - Yes; Tricare - yes  [place] ([address]) [facility] Allen, CPNP; Bates, MD; Gordon, MD; Mills, NP; Weinshilboum, DO [address], [place], [place] [address] [phone] M-F 8:00 - 5:15, Sat/Sun 9-12 sick visits only Medicaid - No; Tricare - yes  [facility] [place] [facility]  Brown, PA-C; Cowen, PA-C; Dennis, DO; Fuster, PA-C; Martin, PA-C; Shelton, PA-C; Spry, MD [address] [place], [place] [address] [phone] Mon-Thur 8:00-7:00, Fri 8:00-5:00 Accepting Medicaid for 13 and under only   [facility] [place] [facility] [place] Hayes, FNP; List, FNP; Moran, MD; Pitonzo, PA-C; Scholer,   MD; Spangle, FNP; Nzenwa, FNP; Jasper, MD; Moran, MD [address] [place], [place] [address] [phone] Mon-Fri 8:30-5:30 Medicaid - Yes; Tricare - yes  [facility] [address] [facility]  Kelly, CPNP; Logan, MD; Poth, MD; Ramadoss, MD; Staton, NP [address], [place], [place] [address] [phone] Mon-Thur 8:00-5:30, Fri 8:00-5:00, Sat 9:00-12:00 Medicaid - yes, Tricare - yes  [place] ([address])  [facility] [place] Masneri, DO; Meyers, MD; Nelson, PA [address], [place], [place] [address] [phone] Mon-Fri 8:00-5:00, closed for lunch 12-1 Medicaid - No; Tricare - yes  [facility] [place] McGowen, MD [address], [place], [place] [address] [phone] Mon-Fri 8:00-5:00 Medicaid - No; Tricare - yes  [facility] [place] MacDonald, MD; Nayak, MD; Kearns, MD; Jones, MD [address] [place] [address], [place], [place] [address] [phone] Mon-Fri 8:00-5:00 Medicaid- Yes; Tricare - yes  [place] ([address])   [facility] [place] [facility] Martin, PA-C; Tabori, MD [address], [place], [place] [address] [phone] Mon-Fri 8:00-5:00 Medicaid - No; Tricare - yes  [facility] [place]  Margin - CPNP [address], [place], [place] [address] [phone] Mon-Weds 8:00-6:00, Thurs-Fri 8:00-5:00, Sat 9:00-12:00 Medicaid - yes; Tricare - yes   [facility] [place] Aubuchon, MD; Brandon, PA [address] [place], [place] [address] [phone] Mon-Fri 8:00-5:00 Medicaid - yes; Tricare - yes  [place] [facility] Providers  [facility] [address], [place], [place] [address] [phone] M, Thur: 8am -8pm, Tues, Weds: 8am - 5pm; Fri: 8-1 Medicaid - Yes; Tricare -  yes  [facility] Mertz, MD; Johnson, MD; Wells, MD; Downs, PA; Hockenberger, PA [address], [place], [place] [address] [phone] M-F 8:30 - 5:00 Medicaid - Call office; Tricare -yes  [facility] Bonney, MD; Page, MD, Minter, MD; Mueller, PNP; Thomason, NP [address], [place], [place] [address] [phone] M-F 8:30 - 5:00, Sat/Sun 8:30 - 12:30 (sick visits) Medicaid - Call office; Tricare -yes  [facility] Lewis, MD; Shaub, PNP; Boylston, MD; Quaile, PA; Nonato, NP; Landon, CPNP [address], [place], [place] [address] [phone] M-F 8:30 - 5:00 Medicaid - Call office; Tricare - yes  [facility] [place] Cline, MD; Dvergsten, MD; Flores, MD; Kawatu, MD; Nogo, MD [address], [place], [place] [address] [phone] M-Thur: 8:00 - 5:00; Fri: 8:00 - 4:00 Medicaid - yes; Tricare - yes  [facility] [address] [place], [place], [place] [address] [phone] M-F: 8:30- 5:00, closed for lunch 12:30 - 1:00 Medicaid - yes; Tricare -yes  [facility] [place] [address], [place], [place] [address] [phone] M-F 8:00 - 5:00 Medicaid - yes; Tricare - yes  [facility] Johnson, DO; Rumball, DO; Wicker, NP [address] [place], [place], [place] [address] [phone] M-F 8:00 - 5:00, Closed 12-1 for lunch Medicaid - Call; Tricare - yes  [facility] Stein, MD [address], [place],  [address] [phone] M-F: 8:00-5:00, Sat: 8:00 - noon Medicaid - call; Tricare -yes  [place] [facility] Providers  [facility] Collins, FNP-C [address], [place],  [address] [phone] M-W: 8:00-5:00, Thur: 8:00 - 7:00, Fri: 8:00 - noon Medicaid - yes; Tricare - yes  [facility] [place] Adams, FNP [address], [place],   [place] [address] [phone] M-F 8:00 - 5:00, Closed for lunch 12-1 Medicaid -  yes; Tricare - yes  [place] [facility] Providers  [facility] Smith, FNP, Melvin, MD, Fay, FNP-C [address] [place] [address] [place], [place], [place] [address] [phone] M-T 8:00-5:00, Wed-Fri 7:00-6:00 Medicaid - Yes; Tricare -yes  [facility] [place] Civiletti, DO; [address], [place], [place] [address] [phone] M-F 8:00 - 5:00, closed for lunch 12-1 Medicaid - Yes; Tricare - yes  [facility]  Barnes, MD; Bergdolt, MD; [place]ulfield, MD; Emrich, MD; Fiscus, MD; Hoppens, MD; Kylstra, MD, McPherson, MD; Todd, MD; Prestwood, MD; Waters, MD; Wood, MD [address], [place], [place] [address] [phone] M-F 8:00-5:00 Medicaid - yes; Tricare - yes  [facility] Cheema, MD (speaks Punjabi and Hindi) [address] [place], [place] [address] [phone] M-F: 8:30 - 5:00, closed 12:30 - 1 for lunch Medicaid - Yes; Tricare -yes  [place] [facility] Providers  [facility] Loda, MD; Timberlake, MD; Burke, MD [address], [place], [place] [address] [phone] M-Th: 8:00 - 5:30, Fri: 8:00 - 12:00 Medicaid - yes; Tricare - yes  [facility] [place] Lookabill, NP; Meier, MD; Daffron, MD [address], [place], [place] [address] [phone] M-F: 8:00 - 5:00 Medicaid - yes; Tricare - yes  [facility] Busse, NP; Bowman, NP; Baune, NP; Entwistle, MD; Williams, MD, Huffman, NP, Ferguson, MD; Patel, DO [address], [place], [place] [address] [phone] M-F: 8:30 - 5:30p Medicaid - yes; Tricare - yes Other locations available as well  [facility] [facility] Rajan, MD; Wilson, MD; Morgan, PA-C, Domenech, PA-C; Myers, PA-C [address], [place], [place] [address] [phone] M-W: 8:00am - 7:00pm, Thurs: 8:00am - 8:00pm; Fri: 8:00am -  5:00pm, closed daily from 12-1 for lunch Medicaid - yes; Tricare - yes  [place] [facility] Providers  [facility] Adams, MD; Crystal, FNP; Hadley, MD; Stokes, MD; Johnson, PNP; Brady, PA-C; West, PNP; Gardner, MD;  [address], [place], [place] [address] [phone] M - Fri: 8am - 5pm, Sat 9-noon Medicaid - Yes; Tricare -yes  [facility] [place] Nayak, MD; Jones, FNP; McDonald, MD; Kearns, MD [address] [place] [address], [place], [place] [phone] M-F 8:00 - 5:00 Medicaid - call; Tricare - yes  [facility] [place] Bell, MD; Emory, PNP; Pinder, MD; Anderson, MD; Light, PA-C; Johnson, MD; Latta, MD; Saul, PNP; Rainey, MD; Clifford, MD; McClung, MD [address], [place], [place] [address] [phone] M-F 8:00am - 5:00pm; Sat. 9:00 - 11:00 Medicaid - yes; Tricare - yes  [facility] [place] Soldato-Couture, MD [address], [place], [place] [address] [phone] M-F 8:00 - 5:00 Medicaid - [place] Medicaid only; Tricare - yes  [facility] [place] Walker, MD; Davis, PNP; Ajizian, MD [address] [place] [address], [place],  [address] [phone] M-F 8:00 - 5:00 Medicaid - yes; Tricare - yes  [facility] [place] Barry, MD; Brown, MD, Forest, MD, Hazek, MD; Hoyle, MD; Smith, MD; [address], [place],  [address] [phone] M-F: 8-5 Medicaid - yes; Tricare - yes  [facility] [place] Brady, Md; Dowlen, MD; [address] [place] [address], [place],  [address] [phone] M-F 8-5 Medicaid - yes; Tricare - yes  [facility] Kearns, MD; Nayak, MD;   Sold[place]-Courture, MD; Pellam-Palmer, DNP; Herring, PNP [address], [place], [place] [address] [phone] M-F 8-5 Medicaid - yes; Tricare - yes  [facility] Weathers, MD;  Merritt, PA-C; Davis-PA-C; Warnimont, MD [address], [place], [place] [address] [phone] M-F 7am - 5 pm Medicaid - call; Tricare - yes  [facility] Hill, PNP; Erickson, MD; Robinson, MD [address], [place], [place] [address] [phone] M-F 8-5 Medicaid - yes; Tricare - yes  [facility] Kribbs, MD; Warner, MD; Williams, FNP; Brooks, FNP; Boles, FNP; Romblad, PA-C; [place] - FNP [address], [place], [place] [address] [phone] M-F 8-5 Medicaid- yes; Tricare - yes  [facility] [place] [place] Yoder, MD; Verenes, MD; Armentrout, MD; Stewart, MD; Beasley, CPNP; Ford, MD; Erickson, MD; Rice, MD [address], [place], [place] [address] [phone] M-F: 8-5, Sat: 9-4, Sun 9-12 Medicaid - yes; Tricare - yes  [facility] Little, PNP; Davis, PNP [address], [place], [place] [address] [phone] M-F 8 - 5, closed 12-1 for lunch Medicaid - yes; Tricare - yes  [facility] Friesen, MD; Cnegia, MD; Rice, MD; Patel, DO [address], [place], [place] [address] [phone] M-F 8- 5:30 Medicaid - yes; Tricare - yes  [facility] [place] Zvolensky, MD; Ray, MD; Haas, MD [address] [address], [place], [place] [address] [phone] M: 8-7; Tues-Fri: 8-5; Sat: 9-12 Medicaid - yes; Tricare - yes  [facility] [place] Heinrich, MD; Meyer, MD; Clark, MD; Rhyne, MD; Aubuchon, MD [address], [place], [place] [address] [phone] M: 8-7; Tues-Fri: 8-5; Sat: 8:30-12:30 Medicaid - yes; Tricare - yes  [facility] [place] Bista, MD; Dillard, PA [address], [place], [place] [address] [phone] Mon-Fri: 8-5 Medicaid - yes;  Tricare - yes  [facility] [place] Beasley, CPNP; Mahle, CPNP; Rice, MD; Duffy, MD; Culler, MD; [address], [place], [place] [address] [phone] M-F: 8-5, closed 1-2 for lunch Medicaid - yes; Tricare - yes  [facility] Rickman, PA; Mounce, NP; Smith, MD; Jordan, CPNP; Darty, PA; Ball, MD; Wallace, MD [address] [place] [address], [place], [place] [address] [phone] M-Thurs: 8-7; Fri: 8-6; Sat: 9-12; Sun 2-4 Medicaid - yes; Tricare - yes  [facility] Brown, MD; Shin, MD; Goodman, DNP, FNP; Sebesta, DO; [address], [place], [place] [address] [phone] M-F: 8-5 Medicaid - yes; Tricare - yes  [place] [facility] Providers  [facility] Dough, MD; Welsh, NP [address], [place], [place] [address] [phone] M - Fri: 8am - 5pm, closed for lunch 12-1 Medicaid - Yes; Tricare - yes  [facility] Manandhar, MD; Riley, MD; Sanger, DO; Vinocur, MD;Hall, PA; Walsh, PA; Campbell, NP [address], [place] [address] [phone] M-F 8:00 - 5:00, Sat 8:00 - 11:30 Medicaid - yes; Tricare - yes  [facility] Khan, MD; Redding, MD, Street, MD, Holt, MD, Burgart, MD; Rhyne, NP; Dickinson, PA;  [address] [place] [address], [place],  [address] [phone] M-F 8:10am - 5:00pm Medicaid - yes; Tricare - yes    [facility] Connors, MD; Kime, NP [address], [place], [place] [address] [phone] M-F 8:00 - 5:00 Medicaid - [place] Medicaid only; Tricare - yes  [facility] [place] Whyte, MD; Fox, NP [address], [place], [place] [address] [phone] M-F 8:00 - 5:00; Closed for lunch 12 - 1:00 Medicaid - yes;  Tricare - yes  [facility] Penner, MD; Wilburn, FNP [address], [place], [place] [address] [phone] Mon 9-5; Tues/Wed 10-5; Thurs 8:30-5; Fri: 8-12:30 Medicaid - yes; Tricare - yes  [place] [facility] Providers  [facility]  Golding, MD; Jackson, PA-C [address], [place], [place] [address] [phone] phone [phone] fax M-F 7:15 - 4:30 Medicaid - yes; Tricare - yes  [facility] Gosrani, MD; Meccariello, DO [address] [place], [place] [address] [phone] M-Fri: 8:30 - 5:00, closed for lunch everyday noon - 1pm Medicaid - Yes; Tricare - yes  [facility] Burdine, MD; Daniel, MD; Howard, MD; Sasser, MD; Boles, PA; Boyd, PA-C; Carroll, PA; McGee, PA; Skillman, PA; Wilson, PA [address] [place], [place] [address] [phone] M-Thurs: 7:30am - 7:00pm; Friday 7:30am - 4pm; Sat: 8:00 - 1:00 Medicaid - Yes; Tricare - yes  [facility] [place] Akhbari, MD; Law, MD; Qayumi, MD; Salvador, DO [address], [place], [place] [address] [phone] M-Thur: 8:00 - 5:00, Fri: 8:00 - Noon Medicaid - yes; Tricare - yes No [employer] [place] [employer]  [facility] [facility] Dettinger, MD; Gottschalk, DO; Hawks, NP; Martin, NP; Morgan, NP; Milian, NP; Rakes, NP; Stacks, MD; Webster, PA [address], [place], [place] [address] [phone] M-F 8:00 - 5:00 Medicaid - yes; Tricare - yes  [facility] Collins, FNP-C; Bucio, FNP-C [address], [place], [place] [address] [phone] M, W, R 8:00-5:00, Tues: 8:00am - 7:00pm; Fri 8:00 - noon Medicaid - Yes; Tricare - yes  [facility] Khan, MD [address] [place] [address] [place], [place] [address] [phone]  M-Thurs 8:30-5:30, Fri: 8:30-12:30pm Medicaid - Yes; Tricare - N  

## 2022-12-31 LAB — CBC/D/PLT+RPR+RH+ABO+RUBIGG...
Antibody Screen: NEGATIVE
Basophils Absolute: 0 10*3/uL (ref 0.0–0.2)
Basos: 0 %
EOS (ABSOLUTE): 0.1 10*3/uL (ref 0.0–0.4)
Eos: 1 %
HCV Ab: NONREACTIVE
HIV Screen 4th Generation wRfx: NONREACTIVE
Hematocrit: 36.6 % (ref 34.0–46.6)
Hemoglobin: 12.5 g/dL (ref 11.1–15.9)
Hepatitis B Surface Ag: NEGATIVE
Immature Grans (Abs): 0 10*3/uL (ref 0.0–0.1)
Immature Granulocytes: 0 %
Lymphocytes Absolute: 2.4 10*3/uL (ref 0.7–3.1)
Lymphs: 22 %
MCH: 31.6 pg (ref 26.6–33.0)
MCHC: 34.2 g/dL (ref 31.5–35.7)
MCV: 93 fL (ref 79–97)
Monocytes Absolute: 0.6 10*3/uL (ref 0.1–0.9)
Monocytes: 6 %
Neutrophils Absolute: 8 10*3/uL — ABNORMAL HIGH (ref 1.4–7.0)
Neutrophils: 71 %
Platelets: 285 10*3/uL (ref 150–450)
RBC: 3.95 x10E6/uL (ref 3.77–5.28)
RDW: 12.9 % (ref 11.7–15.4)
RPR Ser Ql: NONREACTIVE
Rh Factor: POSITIVE
Rubella Antibodies, IGG: 2.61 index (ref >0.99)
WBC: 11.2 10*3/uL — ABNORMAL HIGH (ref 3.4–10.8)

## 2022-12-31 LAB — GC/CHLAMYDIA PROBE AMP (~~LOC~~) NOT AT ARMC
Chlamydia: NEGATIVE
Comment: NEGATIVE
Comment: NORMAL
Neisseria Gonorrhea: NEGATIVE

## 2022-12-31 LAB — HCV INTERPRETATION

## 2022-12-31 LAB — HEMOGLOBIN A1C
Est. average glucose Bld gHb Est-mCnc: 97 mg/dL
Hgb A1c MFr Bld: 5 % (ref 4.8–5.6)

## 2023-01-01 LAB — URINE CULTURE, OB REFLEX: Organism ID, Bacteria: NO GROWTH

## 2023-01-01 LAB — CULTURE, OB URINE

## 2023-01-09 LAB — PANORAMA PRENATAL TEST FULL PANEL:PANORAMA TEST PLUS 5 ADDITIONAL MICRODELETIONS: FETAL FRACTION: 8.2

## 2023-01-09 LAB — HORIZON CUSTOM: REPORT SUMMARY: NEGATIVE

## 2023-01-12 ENCOUNTER — Ambulatory Visit (INDEPENDENT_AMBULATORY_CARE_PROVIDER_SITE_OTHER): Payer: Self-pay | Admitting: Family Medicine

## 2023-01-12 ENCOUNTER — Other Ambulatory Visit: Payer: Self-pay

## 2023-01-12 VITALS — BP 111/72 | HR 81 | Wt 167.9 lb

## 2023-01-12 DIAGNOSIS — Z3480 Encounter for supervision of other normal pregnancy, unspecified trimester: Secondary | ICD-10-CM

## 2023-01-12 DIAGNOSIS — O34219 Maternal care for unspecified type scar from previous cesarean delivery: Secondary | ICD-10-CM

## 2023-01-12 DIAGNOSIS — Z3A14 14 weeks gestation of pregnancy: Secondary | ICD-10-CM

## 2023-01-12 NOTE — Progress Notes (Signed)
INITIAL PRENATAL VISIT  Subjective:   Elaine Douglas is being seen today for her first obstetrical visit.  This is a planned pregnancy. This is a desired pregnancy.  She is at [redacted]w[redacted]d gestation by LMP. Her obstetrical history is significant for  CSx2 . Relationship with FOB: spouse, living together. Patient does intend to breast feed. Pregnancy history fully reviewed.  Patient reports no complaints.  Indications for ASA therapy (per uptodate) One of the following: Previous pregnancy with preeclampsia, especially early onset and with an adverse outcome No Multifetal gestation No Chronic hypertension No Type 1 or 2 diabetes mellitus No Chronic kidney disease No Autoimmune disease (antiphospholipid syndrome, systemic lupus erythematosus) No  Two or more of the following: Nulliparity No Obesity (body mass index >30 kg/m2) No Family history of preeclampsia in mother or sister No Age ?35 years No Sociodemographic characteristics (African American race, low socioeconomic level) Yes Personal risk factors (eg, previous pregnancy with low birth weight or small for gestational age infant, previous adverse pregnancy outcome [eg, stillbirth], interval >10 years between pregnancies) No  Indications for early GDM screening  First-degree relative with diabetes No BMI >30kg/m2 No Age > 25 No Previous birth of an infant weighing ?4000 g No Gestational diabetes mellitus in a previous pregnancy No Glycated hemoglobin ?5.7 percent (39 mmol/mol), impaired glucose tolerance, or impaired fasting glucose on previous testing No High-risk race/ethnicity (eg, African American, Latino, Native American, Asian American, Pacific Islander) Yes Previous stillbirth of unknown cause No Maternal birthweight > 9 lbs No History of cardiovascular disease No Hypertension or on therapy for hypertension No High-density lipoprotein cholesterol level <35 mg/dL (1.61 mmol/L) and/or a triglyceride level >250  mg/dL (0.96 mmol/L) No Polycystic ovary syndrome No Physical inactivity No Other clinical condition associated with insulin resistance (eg, severe obesity, acanthosis nigricans) No Current use of glucocorticoids No   Early screening tests: FBS, A1C, Random CBG, glucose challenge   Review of Systems:   Review of Systems  Objective:    Obstetric History OB History  Gravida Para Term Preterm AB Living  0 3 2  SAB IAB Ectopic Multiple Live Births  3 0 0 0 2    # Outcome Date GA Lbr Len/2nd Weight Sex Delivery Anes PTL Lv  6 Current           5 Term 2013     CS-LTranv  N LIV  4 Term 2009     CS-LTranv  N LIV  3 SAB           2 SAB           1 SAB             Obstetric Comments  1st oligo   2nd rpt    Past Medical History:  Diagnosis Date   Depression    seeing therapist   Medical history non-contributory    Miscarriage     Past Surgical History:  Procedure Laterality Date   CESAREAN SECTION      Current Outpatient Medications on File Prior to Visit  Medication Sig Dispense Refill   folic acid (FOLVITE) 400 MCG tablet Take 400 mcg by mouth daily.     Prenatal Vit-Fe Fumarate-FA (MULTIVITAMIN-PRENATAL) 27-0.8 MG TABS tablet Take 1 tablet by mouth daily at 12 noon.     No current facility-administered medications on file prior to visit.    No Known Allergies  Social History:  reports that she has never smoked. She has been  exposed to tobacco smoke. She has never used smokeless tobacco. She reports that she does not drink alcohol and does not use drugs.  No family history on file.  The following portions of the patient's history were reviewed and updated as appropriate: allergies, current medications, past family history, past medical history, past social history, past surgical history and problem list.  Review of Systems Review of Systems  Constitutional:  Negative for chills and fever.  HENT:  Negative for congestion and sore throat.   Eyes:   Negative for pain and visual disturbance.  Respiratory:  Negative for cough, chest tightness and shortness of breath.   Cardiovascular:  Negative for chest pain.  Gastrointestinal:  Negative for abdominal pain, diarrhea, nausea and vomiting.  Endocrine: Negative for cold intolerance and heat intolerance.  Genitourinary:  Negative for dysuria and flank pain.  Musculoskeletal:  Negative for back pain.  Skin:  Negative for rash.  Allergic/Immunologic: Negative for food allergies.  Neurological:  Negative for dizziness and light-headedness.  Psychiatric/Behavioral:  Negative for agitation.       Physical Exam:  BP 111/72   Pulse 81   Wt 167 lb 14.4 oz (76.2 kg)   LMP 09/30/2022   BMI 29.74 kg/m  CONSTITUTIONAL: Well-developed, well-nourished female in no acute distress.  HENT:  Normocephalic, atraumatic, External right and left ear normal. Oropharynx is clear and moist EYES: Conjunctivae normal. No scleral icterus.  NECK: Normal range of motion, supple, no masses.  Normal thyroid.  SKIN: Skin is warm and dry. No rash noted. Not diaphoretic. No erythema. No pallor. MUSCULOSKELETAL: Normal range of motion. No tenderness.  No cyanosis, clubbing, or edema.   NEUROLOGIC: Alert and oriented to person, place, and time. Normal muscle tone coordination.  PSYCHIATRIC: Normal mood and affect. Normal behavior. Normal judgment and thought content. CARDIOVASCULAR: Normal heart rate noted, regular rhythm RESPIRATORY: Clear to auscultation bilaterally. Effort and breath sounds normal, no problems with respiration noted. BREASTS: Symmetric in size. No masses, skin changes, nipple drainage, or lymphadenopathy. ABDOMEN: Soft, normal bowel sounds, no distention noted.  No tenderness, rebound or guarding. Fundal ht: 15 PELVIC: deferred FHR: 142   Assessment:    Pregnancy: [id number]  1. Supervision of other normal pregnancy, antepartum UTD Reviewed plan of care Labs were drawn at [facility] intake  2.  Previous cesarean delivery affecting pregnancy, antepartum RCS at 39 wks   Plan:     Initial labs drawn. Prenatal vitamins. Problem list reviewed and updated. Reviewed in detail the nature of the practice with collaborative care between  Genetic screening discussed: NIPS Role of ultrasound in pregnancy discussed; Anatomy US: ordered. Amniocentesis discussed: not indicated. Follow up in 4 weeks. Discussed clinic routines, schedule of care and testing, genetic screening options, involvement of students and residents under the direct supervision of APPs and doctors and presence of female providers. Pt verbalized understanding.  Future Appointments  Date Time Provider Department Center  02/09/2023  9:35 AM Celedonio Savage, MD [facility]  02/10/2023 12:45 PM [facility] NURSE [facility]  02/10/2023  1:00 PM [facility] US1 [facility]    Federico Flake, MD 01/17/2023 10:19 AM

## 2023-01-24 ENCOUNTER — Other Ambulatory Visit: Payer: Self-pay

## 2023-02-09 ENCOUNTER — Other Ambulatory Visit: Payer: Self-pay

## 2023-02-09 ENCOUNTER — Ambulatory Visit (INDEPENDENT_AMBULATORY_CARE_PROVIDER_SITE_OTHER): Payer: Self-pay | Admitting: Family Medicine

## 2023-02-09 ENCOUNTER — Encounter: Payer: Self-pay | Admitting: *Deleted

## 2023-02-09 VITALS — BP 113/54 | HR 76 | Wt 173.0 lb

## 2023-02-09 DIAGNOSIS — O34219 Maternal care for unspecified type scar from previous cesarean delivery: Secondary | ICD-10-CM

## 2023-02-09 DIAGNOSIS — Z3480 Encounter for supervision of other normal pregnancy, unspecified trimester: Secondary | ICD-10-CM

## 2023-02-09 DIAGNOSIS — Z3A18 18 weeks gestation of pregnancy: Secondary | ICD-10-CM

## 2023-02-09 NOTE — Progress Notes (Signed)
   PRENATAL VISIT NOTE  Subjective:  Elaine Douglas is a 32 y.o. [id number] at [redacted]w[redacted]d being seen today for ongoing prenatal care.  She is currently monitored for the following issues for this low-risk pregnancy and has Supervision of other normal pregnancy, antepartum and Previous cesarean delivery affecting pregnancy, antepartum on their problem list.  Patient reports no complaints.  Contractions: Not present. Vag. Bleeding: None.  Movement: Present. Denies leaking of fluid.   The following portions of the patient's history were reviewed and updated as appropriate: allergies, current medications, past family history, past medical history, past social history, past surgical history and problem list.   Objective:   Vitals:   02/09/23 0945  BP: (!) 113/54  Pulse: 76  Weight: 173 lb (78.5 kg)    Fetal Status: Fetal Heart Rate (bpm): 154   Movement: Present     General:  Alert, oriented and cooperative. Patient is in no acute distress.  Skin: Skin is warm and dry. No rash noted.   Cardiovascular: Normal heart rate noted  Respiratory: Normal respiratory effort, no problems with respiration noted  Abdomen: Soft, gravid, appropriate for gestational age.  Pain/Pressure: Absent     Pelvic: Cervical exam deferred        Extremities: Normal range of motion.  Edema: None  Mental Status: Normal mood and affect. Normal behavior. Normal judgment and thought content.   Assessment and Plan:  Pregnancy: [id number] at [redacted]w[redacted]d 1. Supervision of other normal pregnancy, antepartum Routine prenatal care - AFP, Serum, Open Spina Bifida  2. Previous cesarean delivery affecting pregnancy, antepartum Planning repeat CS at 39 weeks  3. [redacted] weeks gestation of pregnancy  Preterm labor symptoms and general obstetric precautions including but not limited to vaginal bleeding, contractions, leaking of fluid and fetal movement were reviewed in detail with the patient. Please refer to After Visit Summary for  other counseling recommendations.   No follow-ups on file.  Future Appointments  Date Time Provider Department Center  02/10/2023 12:45 PM [facility] NURSE [facility]  02/10/2023  1:00 PM [facility] US1 [facility]    Celedonio Savage, MD

## 2023-02-10 ENCOUNTER — Other Ambulatory Visit: Payer: Self-pay

## 2023-02-10 ENCOUNTER — Ambulatory Visit: Payer: Self-pay | Attending: Maternal & Fetal Medicine | Admitting: Maternal & Fetal Medicine

## 2023-02-10 ENCOUNTER — Ambulatory Visit: Payer: Self-pay | Attending: Family Medicine

## 2023-02-10 ENCOUNTER — Ambulatory Visit: Payer: Self-pay

## 2023-02-10 VITALS — BP 108/52 | HR 80

## 2023-02-10 DIAGNOSIS — O34219 Maternal care for unspecified type scar from previous cesarean delivery: Secondary | ICD-10-CM

## 2023-02-10 DIAGNOSIS — O365920 Maternal care for other known or suspected poor fetal growth, second trimester, not applicable or unspecified: Secondary | ICD-10-CM

## 2023-02-10 DIAGNOSIS — O43192 Other malformation of placenta, second trimester: Secondary | ICD-10-CM

## 2023-02-10 DIAGNOSIS — O43212 Placenta accreta, second trimester: Secondary | ICD-10-CM

## 2023-02-10 DIAGNOSIS — Z3480 Encounter for supervision of other normal pregnancy, unspecified trimester: Secondary | ICD-10-CM

## 2023-02-10 DIAGNOSIS — Z3A19 19 weeks gestation of pregnancy: Secondary | ICD-10-CM | POA: Insufficient documentation

## 2023-02-10 DIAGNOSIS — O43102 Malformation of placenta, unspecified, second trimester: Secondary | ICD-10-CM

## 2023-02-10 DIAGNOSIS — Q270 Congenital absence and hypoplasia of umbilical artery: Secondary | ICD-10-CM

## 2023-02-10 DIAGNOSIS — Z363 Encounter for antenatal screening for malformations: Secondary | ICD-10-CM | POA: Insufficient documentation

## 2023-02-10 DIAGNOSIS — R9389 Abnormal findings on diagnostic imaging of other specified body structures: Secondary | ICD-10-CM

## 2023-02-10 DIAGNOSIS — O4402 Placenta previa specified as without hemorrhage, second trimester: Secondary | ICD-10-CM

## 2023-02-10 DIAGNOSIS — O43122 Velamentous insertion of umbilical cord, second trimester: Secondary | ICD-10-CM | POA: Insufficient documentation

## 2023-02-10 DIAGNOSIS — O289 Unspecified abnormal findings on antenatal screening of mother: Secondary | ICD-10-CM | POA: Insufficient documentation

## 2023-02-10 DIAGNOSIS — O09899 Supervision of other high risk pregnancies, unspecified trimester: Secondary | ICD-10-CM

## 2023-02-10 DIAGNOSIS — O283 Abnormal ultrasonic finding on antenatal screening of mother: Secondary | ICD-10-CM

## 2023-02-10 NOTE — Progress Notes (Signed)
Patient information  Patient Name: Elaine Douglas  Patient MRN:   [id number]  Referring practice: MFM Referring Provider: [facility])  MFM CONSULT  Mahaley ERIKA KAYLEIGHA SLAGHT is a 32 y.o. [id number] at [redacted]w[redacted]d here for ultrasound and consultation.   RE possible PAS: The patient has a history of two prior cesarean deliveries and now what appears to be an anterior previa. Based on this, her a priori risk of a placenta accreta is about 10-15%.  However, there are numerous features that are also consistent with placenta accreta spectrum on her ultrasound including numerous abnormal appearing lacunae, myometrial thinning, uterovesical hypervascularity, subplacental hypervascularity, bridging vessels and loss of the retroplacental hypoechoic zone in the midline suggestive of a focal placenta accreta.  The placenta also wraps around right and laterally and it is difficult to appreciate the extent of the possible placenta accreta.  I discussed my concerns with the patient stating that this can be a life-threatening condition due to maternal hemorrhage.  I also discussed the treatment is cesarean delivery around 34 to 35 weeks followed by hysterectomy.  I discussed that this is a procedure that is best done at an experienced center with multidisciplinary consultation with OB anesthesia, maternal-fetal medicine, obstetrics and NICU.  The patient was very distraught emotionally about this and had a difficult time processing the diagnosis.  I provided as much information that she was able to handle at this time.  I discussed that I would like to see her back in 1 week to reemphasize any counseling points and answer any of her questions.  I also discussed the need to refer her to a tertiary care center that routinely delivers patients with this diagnosis.  She verbalized understanding and request to be sent to [facility].  I will arrange consultation with the MFM service.  I also  discussed that some patients can decide to terminate pregnancy but this would be best discussed by the team who would be able to provide that service if she would like that.  She verbalized being very overwhelmed and would like to discuss this with her husband further before making any definitive decision.    RE fetal growth restriction and amelia of the right lower leg: There is ameilia of the right lower extremity.  There is no evidence of the tibia or fibula or foot on the right lower extremity.  I discussed this is most likely suggestive of either a vascular accident or amniotic band syndrome.  There is no evidence of amniotic bands attaching to the fetus or placenta.  The skeletal survey appears normal although the fetal long bones are on the lower end of normal and the fetus overall is growth restricted.  The bones do appear normally mineralized and have normal morphology.  Her due date is based on her LMP but this EDD is identical to an [redacted]w[redacted]d ultrasound.  I discussed the various potential causes of fetal growth restriction including but not limited to placental insufficiency, abnormal placentation, genetic or infectious causes.  I discussed the role of amniocentesis offering her karyotype with [employer].  After counseling her about the risks, benefits and alternatives to amniocentesis she verbalized that she would like to wait at least 1 week to consider the procedure more.  Also discussed the importance of serial growth ultrasounds and umbilical artery Doppler studies to monitor for signs of fetal deterioration.  We discussed the role of antenatal testing later in the pregnancy and need for  early delivery, but the exact timing will likely be decided by the presence of possible PAS.   RE single umbilical artery: I discussed the finding and clinical significance of a single umbilical artery which was seen on today's ultrasound.  This is thought to be due to atrophy of one of the umbilical arteries.  This  occurs in approximately 1 and 200 Singleton's and up to 5% of twins.  The left artery is slightly more frequently absent than the right (60 to 70%).  Up to one third of these fetuses will have an additional anomaly typically including a cardiovascular, GI or renal malformation.  Today's ultrasound does show amelia of the right lower extremity but no other discernible defects.  While most isolated cases are associated with normal genetic analysis, up to 5 to 50% may have an abnormal genetic screen if there are other existing anomalies.  There are high risk of certain extent complications such as fetal growth restriction, preterm birth, NICU admission and perinatal mortality.  Antenatal testing is typically only indicated if there is coexisting fetal growth restriction.  Isolated SUA has not been directly linked to postnatal neurologic abnormalities, but one study documented a 60% excess of children who did not complete compulsory schooling.  RE marginal cord insertion:   Marginal cord insertion was seen on today's ultrasound. I discussed the diagnosis, management and prognosis of pregnancy associated with the marginal umbilical cord insertion in pregnancy.  Normally the umbilical cord inserts centrally into the placenta, however, with a marginal cord insertion the umbilical cord inserts within less than 2 cm from the placental edge.  This is also known as a battledore placenta.  It occurs in about 6% of singleton pregnancies but is as high as 10 to 15% in twin pregnancies. Marginal cord insertion is even more common in monochorionic twins, and it has been associated with an increased risk for an SGA newborn in monochorionic but not dichorionic twin pregnancies. In singleton pregnancies, there are associations with adverse centric outcomes such as  placental abruption (odds ratio 1.5), placenta previa (odds ratio 1.8), and small-for-gestational age (SGA) neonate (odds ratio 1.2), but not perinatal deaths. I  explained that because of the fetal growth restriction, it is recommended that serial growth ultrasounds be performed during the pregnancy.  Sonographic findings Single intrauterine pregnancy. Fetal cardiac activity:  Observed and appears normal. Presentation: Cephalic. There is ameilia of the right lower extremity.  There is no evidence of the tibia or fibula or foot on the right lower extremity. This is most likely suggestive of either a vascular accident or amniotic band syndrome.  There is no evidence of amniotic bands attaching to the fetus or placenta.  The remainder of the skeletal survey appears normal although the fetal long bones are on the lower end of normal and the fetus overall is growth restricted.  The bones do appear normally mineralized and have normal morphology. Theis a two vessel umbilical cord with a marginal cord insertion. The remainder of anatomic structures that were well seen appear normal. Due to poor acoustic windows, the visualization some structures remain suboptimally seen. Fetal biometry shows the estimated fetal weight at the 1.9 percentile and the [facility] at the 21 percentile.  Amniotic fluid volume: Within normal limits. MVP: 4.19 cm. Placenta: There is concern for placenta accreta spectrum on today's ultrasound.  The placenta is anterior with a previa that wraps around to the right side of the maternal uterine surface.  There are numerous findings below consistent with  a placenta accreta spectrum. Findings on today's ultrasound that can be associated with the presence of placental accreta spectrum include: -Loss of retroplacental hypoechoic zone: YES -Abnormal placental lacunae (> 3 in number, > 2 cm, high velocity flow > 15 cm/sec): YES--none with high velocity flow -Myometrial thinning (< 1 mm): Yes -Bladder wall interruption: NO -Focal exophytic mass: NO -Uterovesical hypervascularity: YES -Subplacental hypervascularity: YES -Bridging vessels: YES  -Placental  lacunae feeder vessels: NO   Assessment 1. 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 Placenta previa (anterior) antepartum, second trimester 3. Right sided fetal amelia of the lower extremity 4. Single umbilical artery 5. Marginal insertion of umbilical cord affecting management of mother in second trimester 6. Poor fetal growth affecting management of mother in singleton pregnancy in second trimester 7. Previous cesarean delivery affecting pregnancy, antepartum  Plan -Refer to [facility] for assessment of placenta accreta spectrum -Follow-up ultrasound in 1 week to reassess the placenta and possible amniocentesis as well as answer any of the patient's questions regarding these diagnoses -Genetic counseling can be considered as well if the patient desires -Serial growth ultrasounds every 3 to 4 weeks -Umbilical artery Dopplers every 1 to 2 weeks -Antenatal testing can start between 24 and 28 weeks depending upon the fetal growth -Delivery likely around 34 to 35 weeks via cesarean hysterectomy if there is significant concern for placenta accreta during the remainder of the pregnancy  Review of Systems: A review of systems was performed and was negative except per HPI   Vitals and Physical Exam    02/10/2023   12:42 PM 02/09/2023    9:45 AM 01/12/2023    9:59 AM  Vitals with BMI  Weight  173 lbs 167 lbs 14 oz  Systolic 108 113 130  Diastolic 52 54 72  Pulse 80 76 81  Sitting comfortably on the sonogram table Nonlabored breathing Normal rate and rhythm Abdomen is nontender  Past pregnancies OB History  Gravida Para Term Preterm AB Living  6 2 2  0 3 2  SAB IAB Ectopic Multiple Live Births  3 0 0 0 2    # Outcome Date GA Lbr Len/2nd Weight Sex Delivery Anes PTL Lv  6 Current           5 Term 2013     CS-LTranv  N LIV  4 Term 2009     CS-LTranv  N LIV  3 SAB           2 SAB           1 SAB             Obstetric Comments  1st oligo   2nd rpt    I spent 65 minutes  reviewing the patients chart, including labs and images as well as counseling the patient about her medical conditions. Greater than 50% of the time was spent in direct face-to-face patient counseling.  Braxton Feathers  MFM, [facility]   02/10/2023  3:59 PM

## 2023-02-11 LAB — AFP, SERUM, OPEN SPINA BIFIDA
AFP MoM: 0.89
AFP Value: 38.8 ng/mL
Gest. Age on Collection Date: 18.6 weeks
Maternal Age At EDD: 33.4 yr
OSBR Risk 1 IN: 10000
Test Results:: NEGATIVE
Weight: 173 [lb_av]

## 2023-02-16 ENCOUNTER — Ambulatory Visit: Payer: Self-pay | Admitting: *Deleted

## 2023-02-16 ENCOUNTER — Other Ambulatory Visit: Payer: Self-pay | Admitting: *Deleted

## 2023-02-16 ENCOUNTER — Ambulatory Visit: Payer: Self-pay

## 2023-02-16 ENCOUNTER — Ambulatory Visit: Payer: Self-pay | Attending: Obstetrics and Gynecology

## 2023-02-16 VITALS — BP 132/63 | HR 86

## 2023-02-16 DIAGNOSIS — O34219 Maternal care for unspecified type scar from previous cesarean delivery: Secondary | ICD-10-CM

## 2023-02-16 DIAGNOSIS — O283 Abnormal ultrasonic finding on antenatal screening of mother: Secondary | ICD-10-CM

## 2023-02-16 DIAGNOSIS — O365920 Maternal care for other known or suspected poor fetal growth, second trimester, not applicable or unspecified: Secondary | ICD-10-CM | POA: Insufficient documentation

## 2023-02-16 DIAGNOSIS — O09899 Supervision of other high risk pregnancies, unspecified trimester: Secondary | ICD-10-CM | POA: Insufficient documentation

## 2023-02-16 DIAGNOSIS — O0992 Supervision of high risk pregnancy, unspecified, second trimester: Secondary | ICD-10-CM | POA: Insufficient documentation

## 2023-02-16 DIAGNOSIS — O43102 Malformation of placenta, unspecified, second trimester: Secondary | ICD-10-CM | POA: Insufficient documentation

## 2023-02-16 DIAGNOSIS — Z3A19 19 weeks gestation of pregnancy: Secondary | ICD-10-CM

## 2023-02-16 DIAGNOSIS — O43192 Other malformation of placenta, second trimester: Secondary | ICD-10-CM | POA: Insufficient documentation

## 2023-03-02 ENCOUNTER — Ambulatory Visit: Payer: Self-pay | Attending: Obstetrics and Gynecology

## 2023-03-02 ENCOUNTER — Ambulatory Visit: Payer: Self-pay

## 2023-03-10 ENCOUNTER — Encounter: Payer: Self-pay | Admitting: Obstetrics and Gynecology

## 2023-04-06 IMAGING — US US OB < 14 WEEKS - US OB TV
1 series · 15 of 28 positions shown · non-contrast
Comparison: None.

CLINICAL DATA: Vaginal spotting.

EXAM:
OBSTETRIC <14 WK US AND TRANSVAGINAL OB US
TECHNIQUE: Both transabdominal and transvaginal ultrasound examinations were
performed for complete evaluation of the gestation as well as the
maternal uterus, adnexal regions, and pelvic cul-de-sac.
Transvaginal technique was performed to assess early pregnancy.

[Series 1: us ob < 14 weeks - us ob tv · 45 acquisitions, 15 frames shown]
[im 1/45]
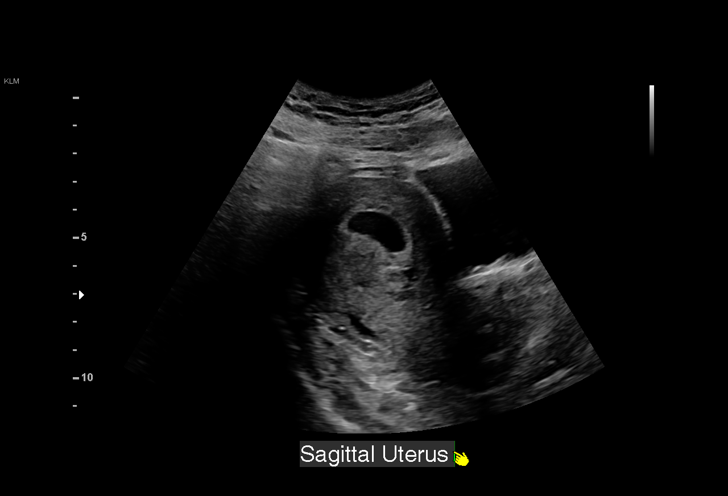
[im 4/45]
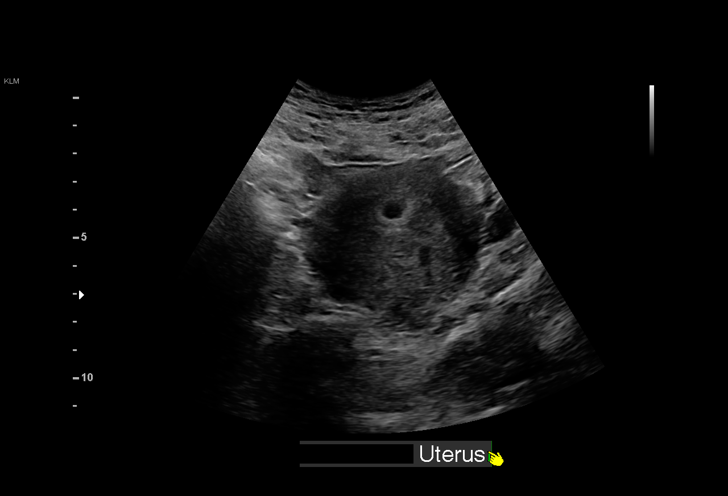
[im 7/45]
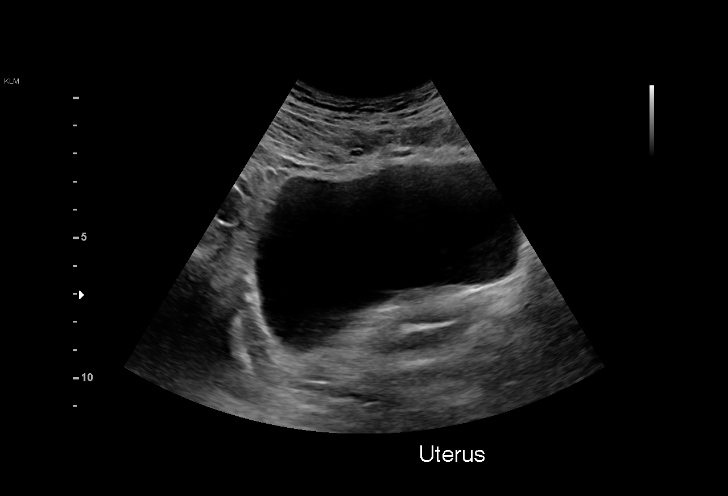
[im 10/45]
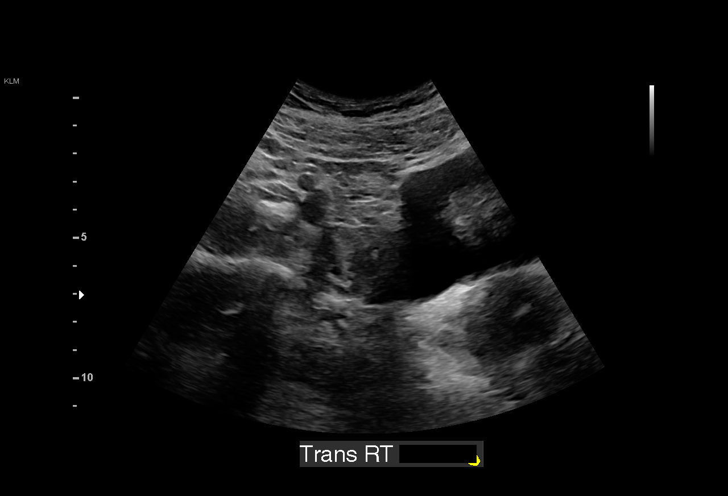
[im 14/45]
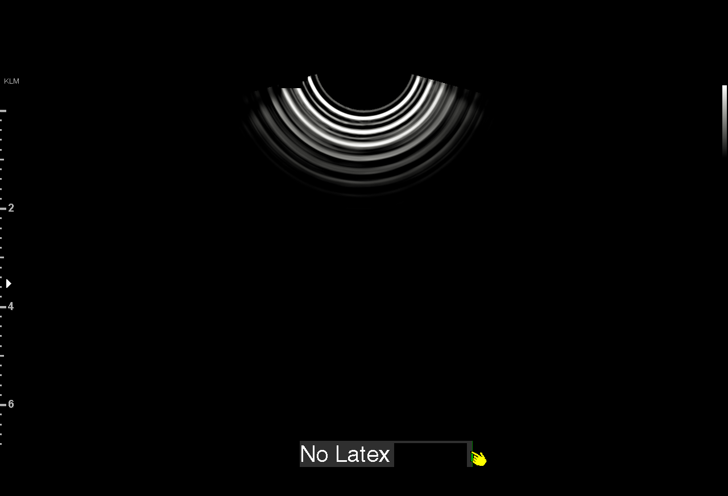
[im 17/45]
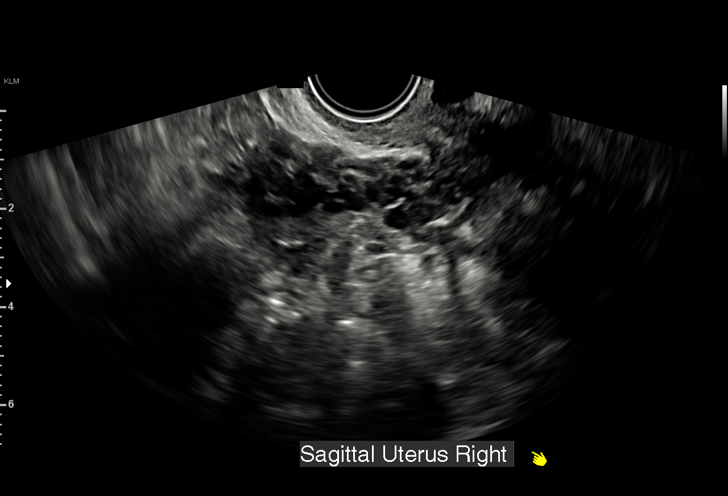
[im 20/45]
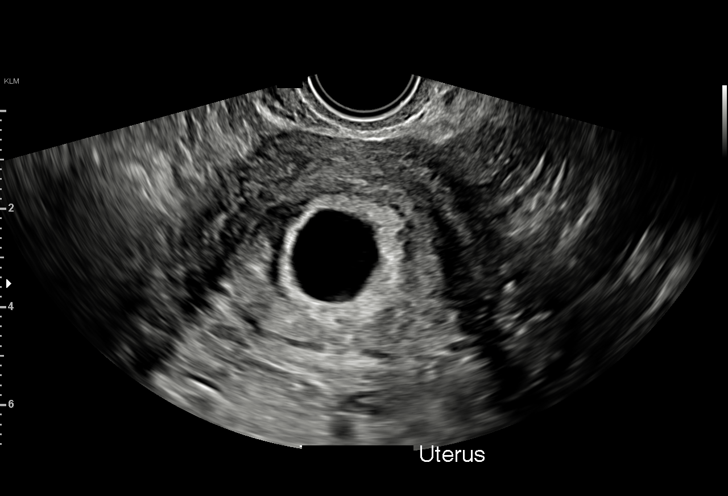
[im 23/45]
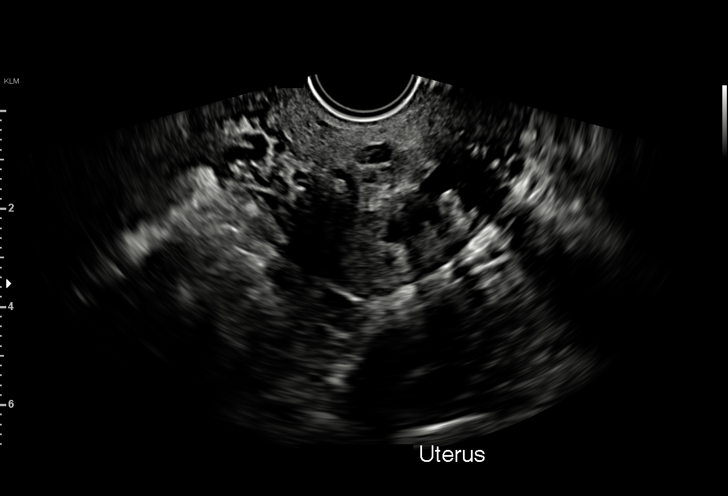
[im 25/45]
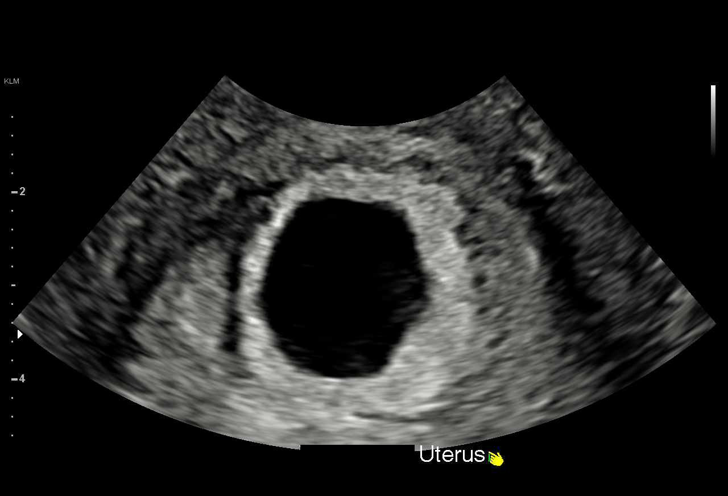
[im 28/45]
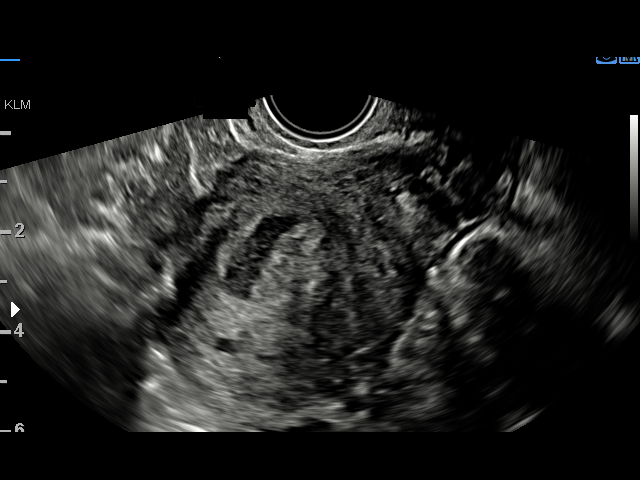
[im 31/45]
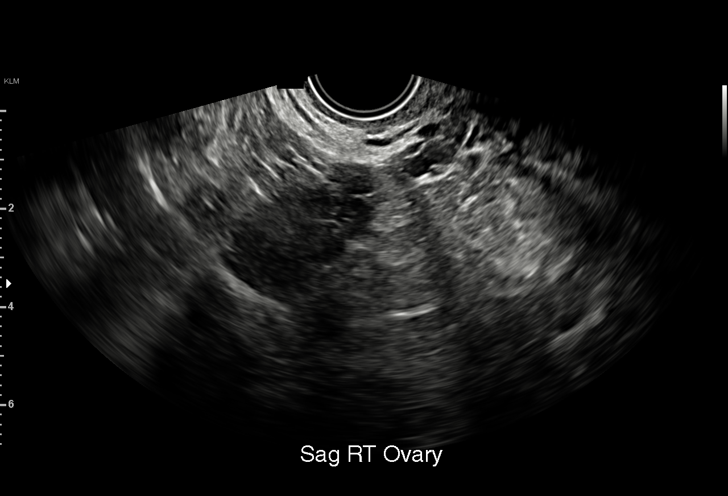
[im 35/45]
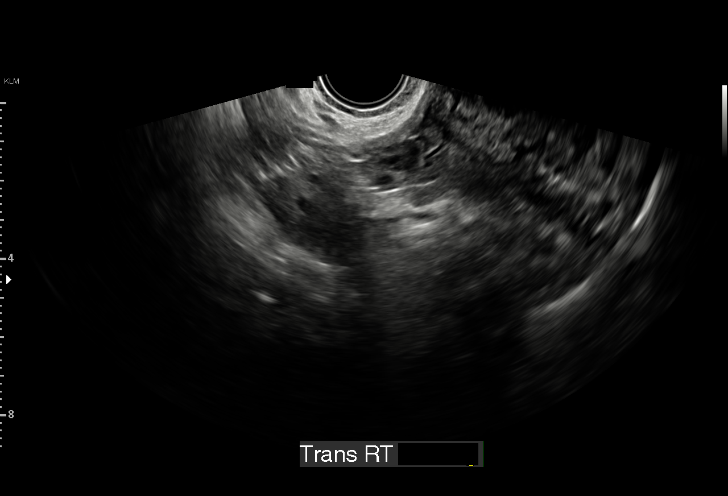
[im 38/45]
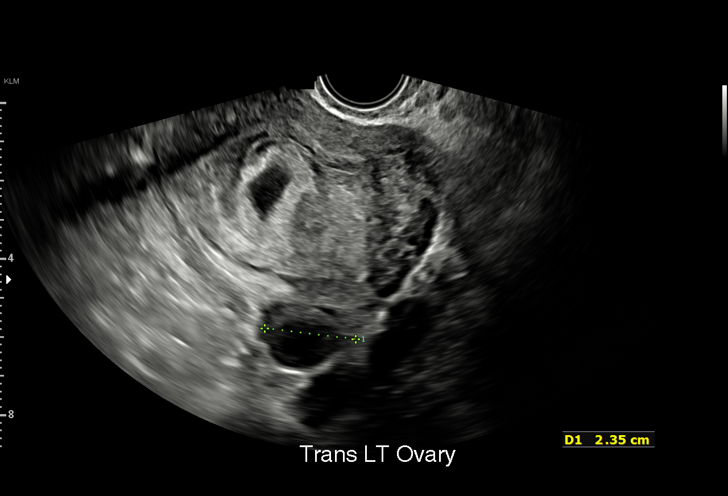
[im 41/45]
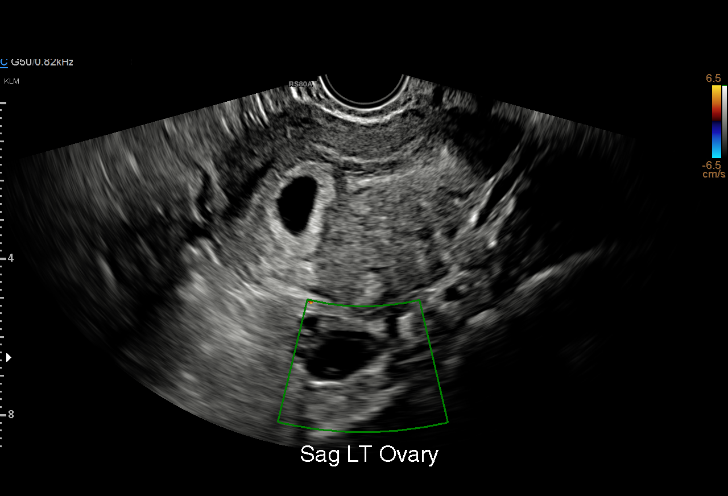
[im 45/45]
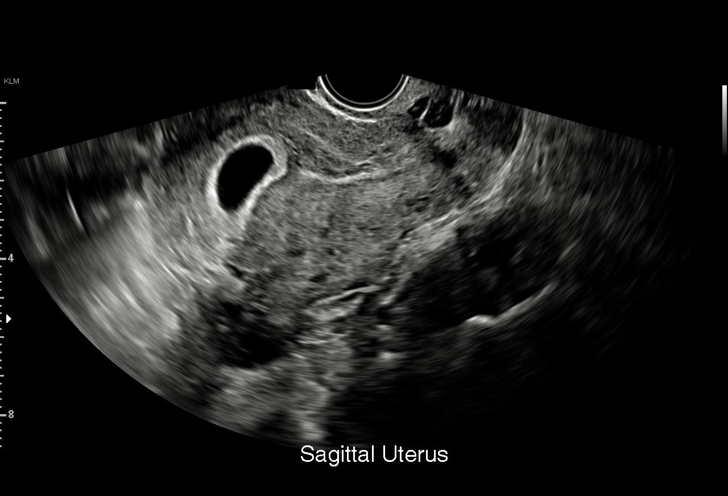

[15 of 28 positions shown; findings below may reference images not displayed]

FINDINGS: Intrauterine gestational sac: Single

Yolk sac:  Not Visualized.

Embryo:  Not Visualized.

Cardiac Activity: Not Visualized.

Heart Rate: N/A  bpm

MSD: 16.3 mm   6 w   3 d

Subchorionic hemorrhage:  Small

Maternal uterus/adnexae: The bilateral ovaries are visualized and
are normal in appearance.

No pelvic free fluid is identified.
IMPRESSION: Probable early intrauterine gestational sac, but no yolk sac, fetal
pole, or cardiac activity yet visualized. Recommend follow-up
quantitative B-HCG levels and follow-up US in 14 days to assess
viability. This recommendation follows SRU consensus guidelines:
Diagnostic Criteria for Nonviable Pregnancy Early in the First
Trimester. N Engl J Med 1425; [DATE].

## 2023-04-10 IMAGING — US US OB TRANSVAGINAL
1 series · 15 of 24 positions shown · non-contrast
Comparison: 10/15/2021.

CLINICAL DATA: Vaginal bleeding in first-trimester pregnancy.

EXAM:
OBSTETRIC <14 WK ULTRASOUND
TECHNIQUE: Transabdominal ultrasound was performed for evaluation of the
gestation as well as the maternal uterus and adnexal regions.

[Series 1: us ob transvaginal · 24 acquisitions, 15 frames shown]
[im 1/24]
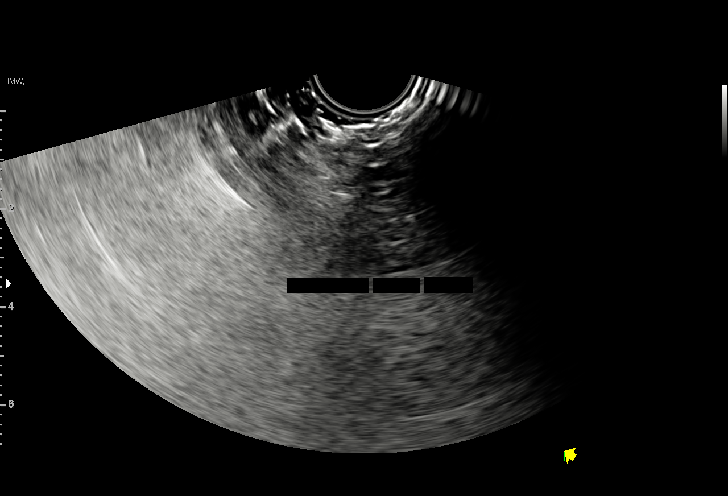
[im 3/24]
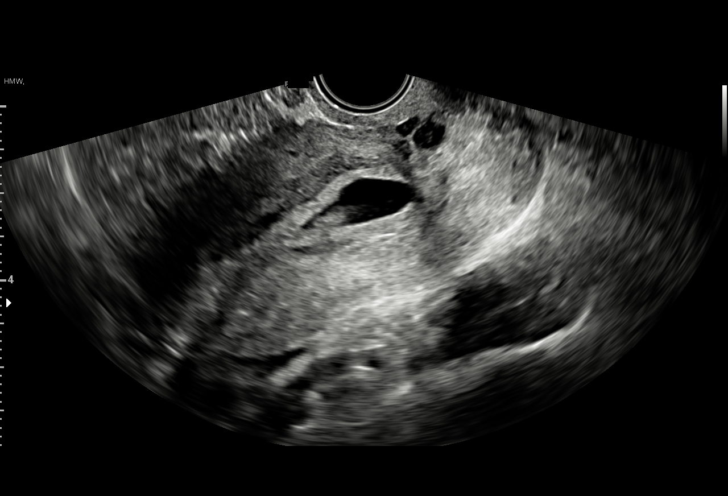
[im 5/24]
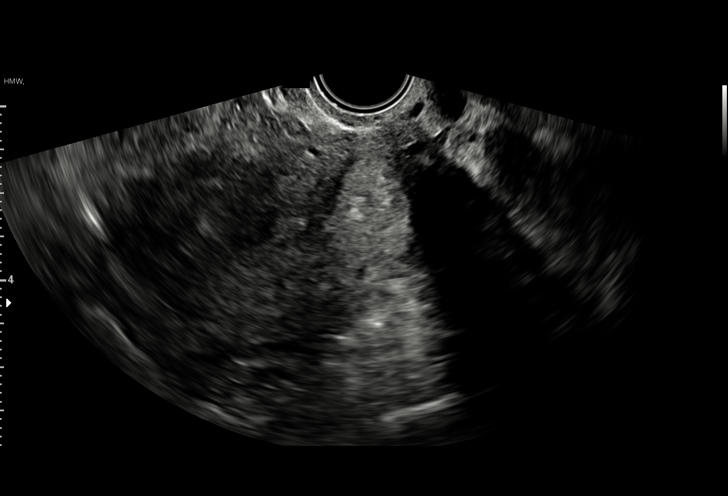
[im 6/24]
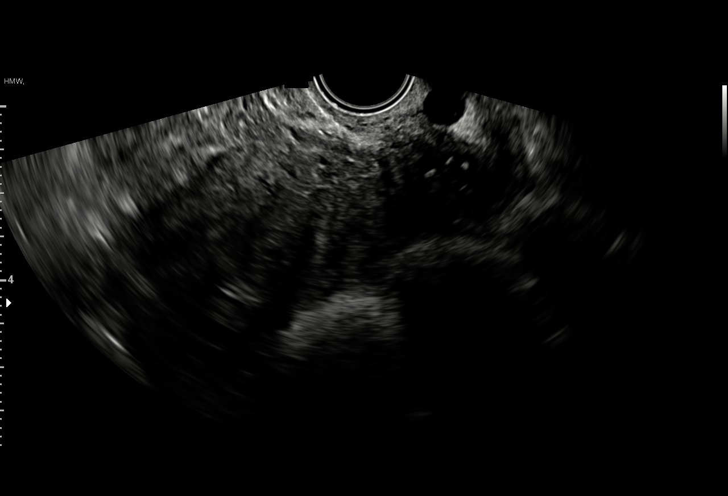
[im 8/24]
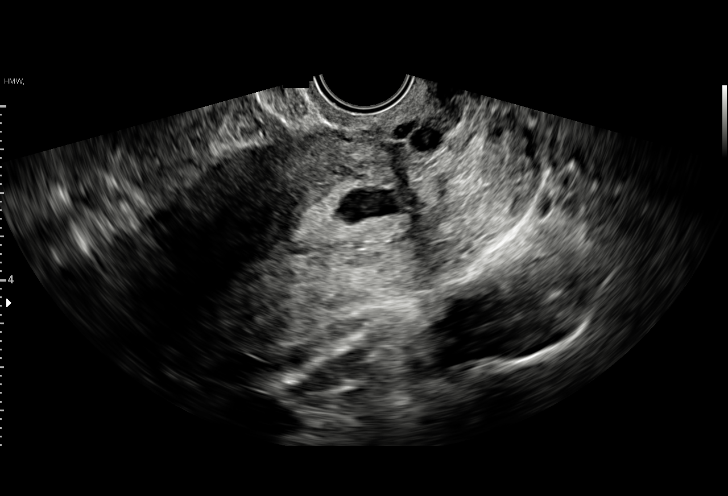
[im 9/24]
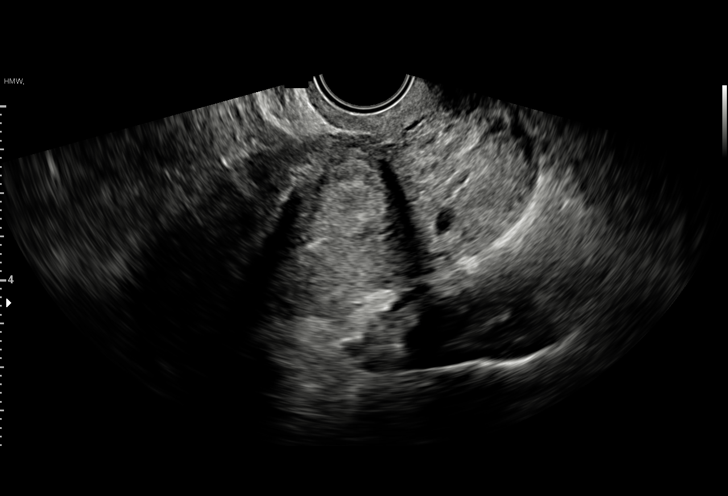
[im 11/24]
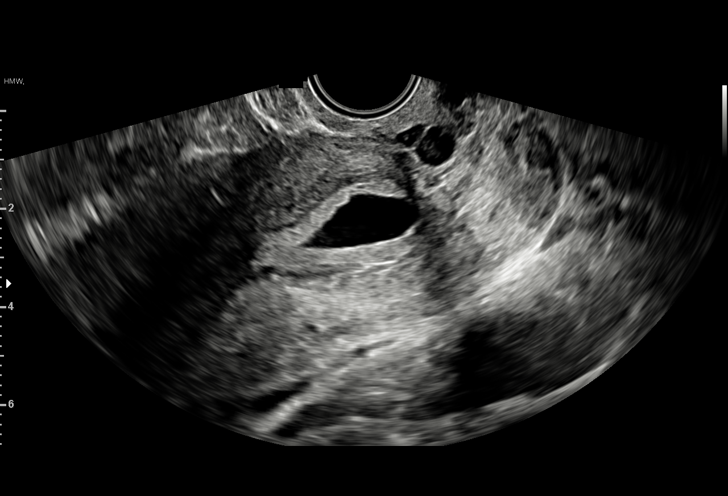
[im 13/24]
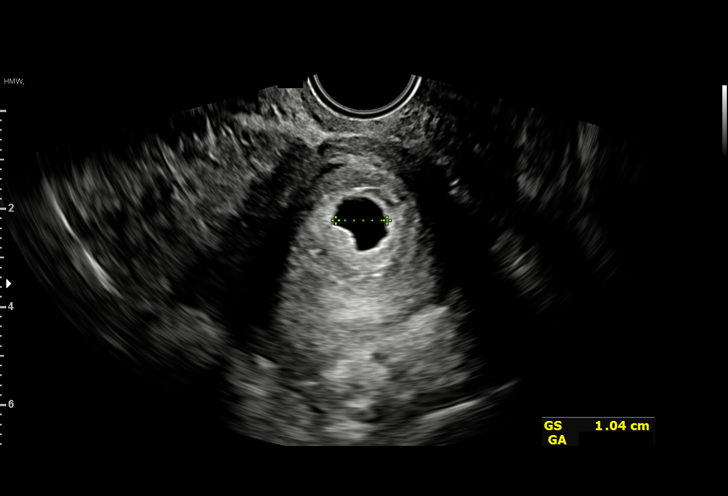
[im 14/24]
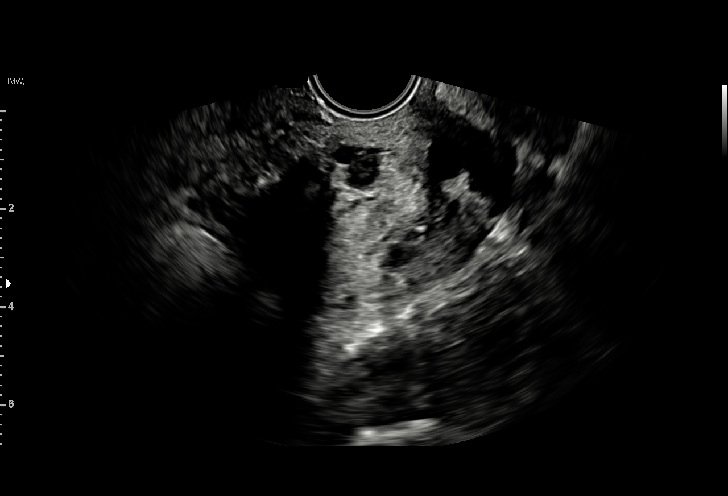
[im 16/24]
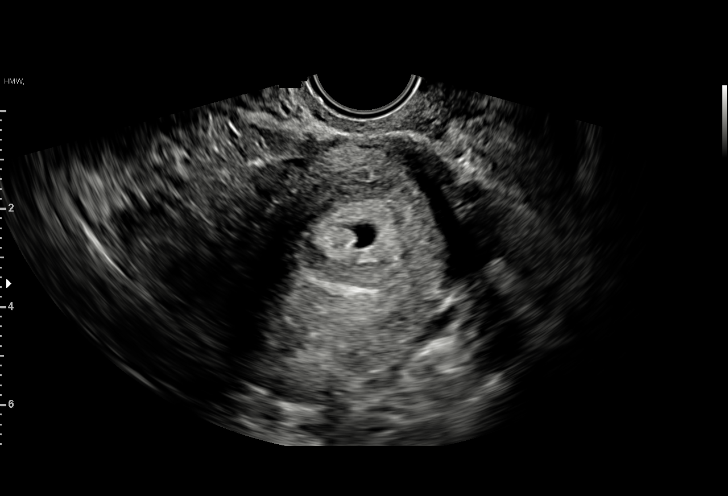
[im 17/24]
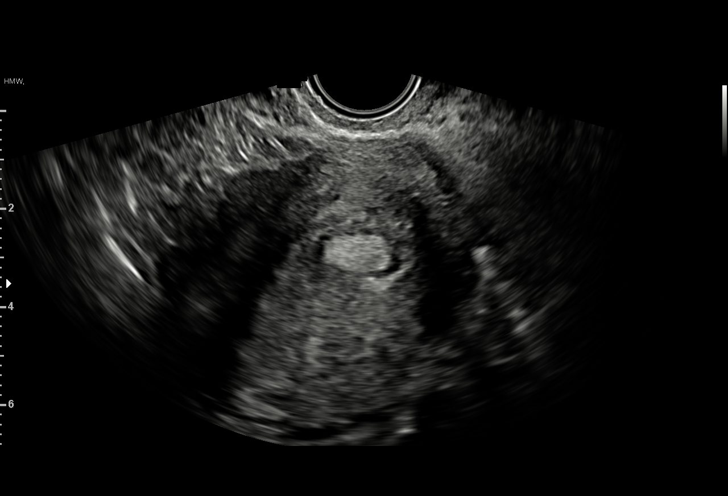
[im 19/24]
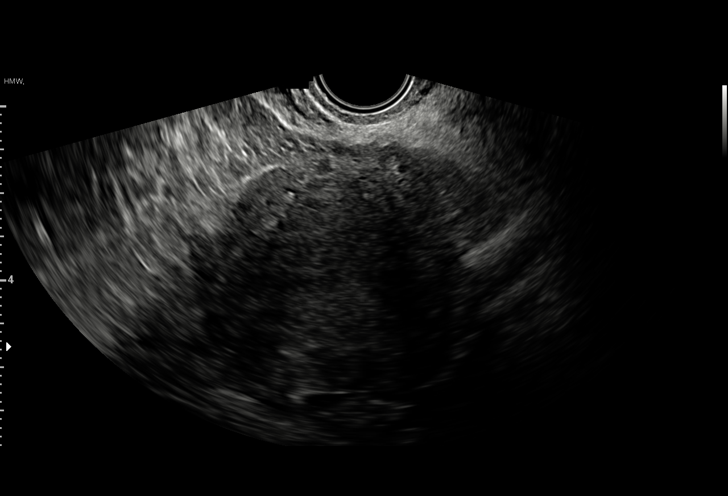
[im 21/24]
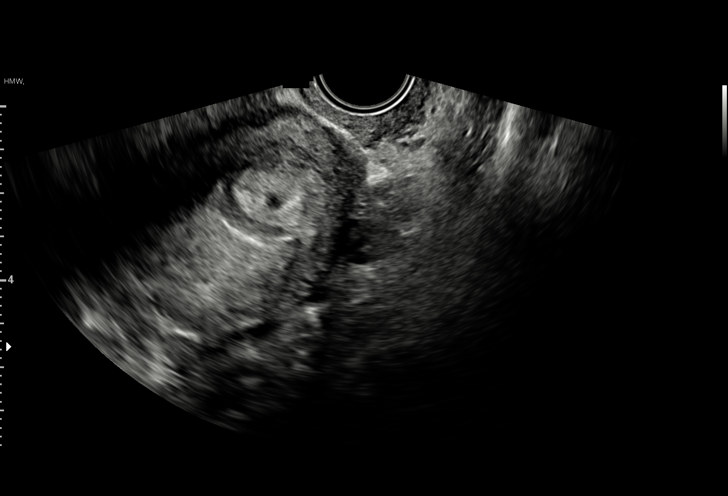
[im 22/24]
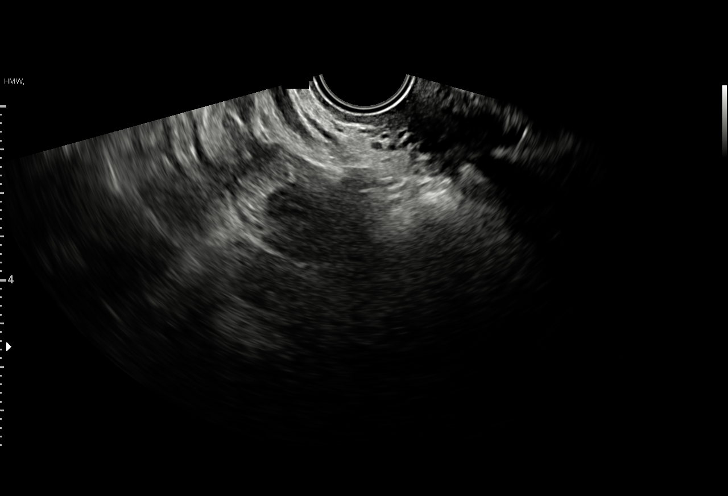
[im 24/24]
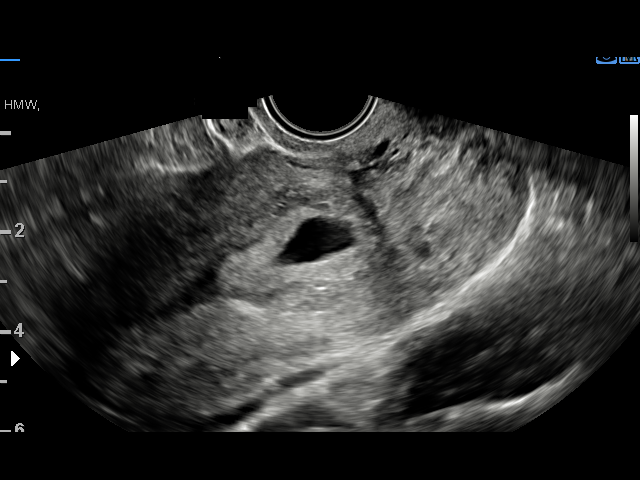

[15 of 24 positions shown; findings below may reference images not displayed]

FINDINGS: Intrauterine gestational sac: Present within the body/lower uterine
segment.

Yolk sac:  Absent.

Embryo:  Absent.

Cardiac Activity: Absent.

MSD:  15.4 mm   6 w   2 d

Subchorionic hemorrhage:  None visualized.

Maternal uterus/adnexae: Ovaries are not visualized.  No free fluid.
IMPRESSION: Gestational sac within the body/lower uterine segment. No visible
embryo. Consider short-term follow-up ultrasound in further
evaluation, as clinically indicated.

## 2023-04-20 IMAGING — US US OB TRANSVAGINAL
1 series · 15 of 28 positions shown · non-contrast
Comparison: 10/19/2021 and earlier.

CLINICAL DATA: 30-year-old female with vaginal bleeding in the 1st
trimester of pregnancy. Intrauterine gestational sac suspected on
since 10/15/2021, but no visible yolk sac or embryo as of
10/19/2021. Subsequent encounter.

EXAM:
TRANSVAGINAL OB ULTRASOUND
TECHNIQUE: Transvaginal ultrasound was performed for complete evaluation of the
gestation as well as the maternal uterus, adnexal regions, and
pelvic cul-de-sac.

[Series 1: us ob transvaginal · 15 of 57 slices shown]
[im 1/57]
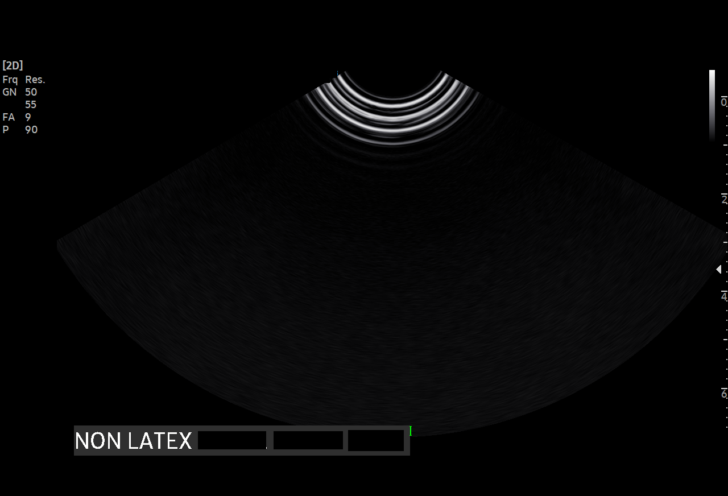
[im 5/57]
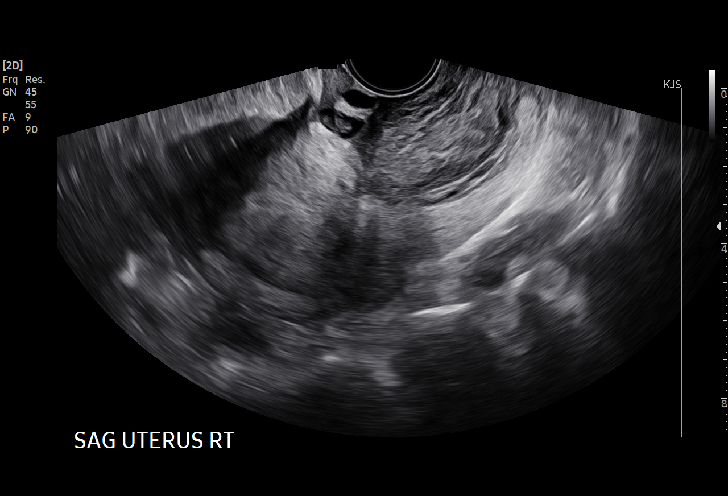
[im 9/57]
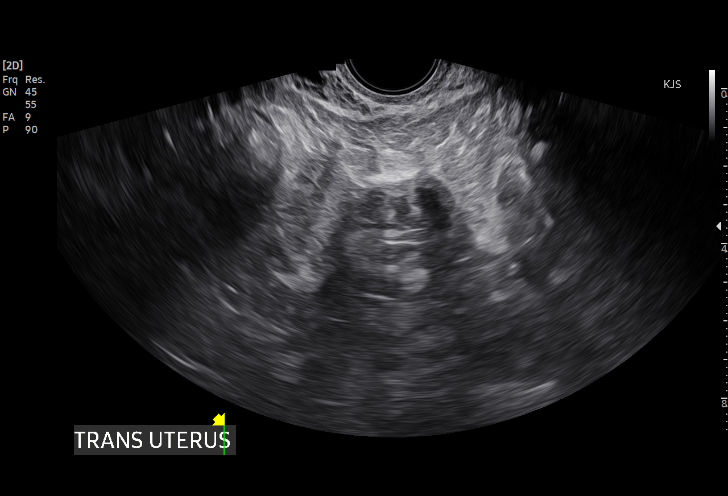
[im 13/57]
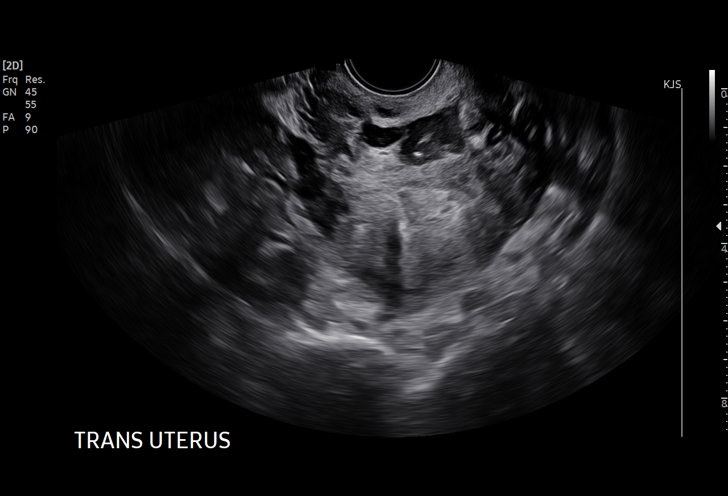
[im 17/57]
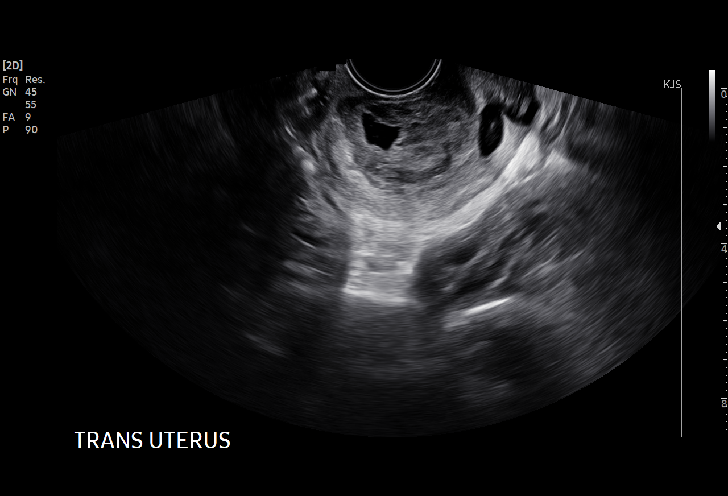
[im 21/57]
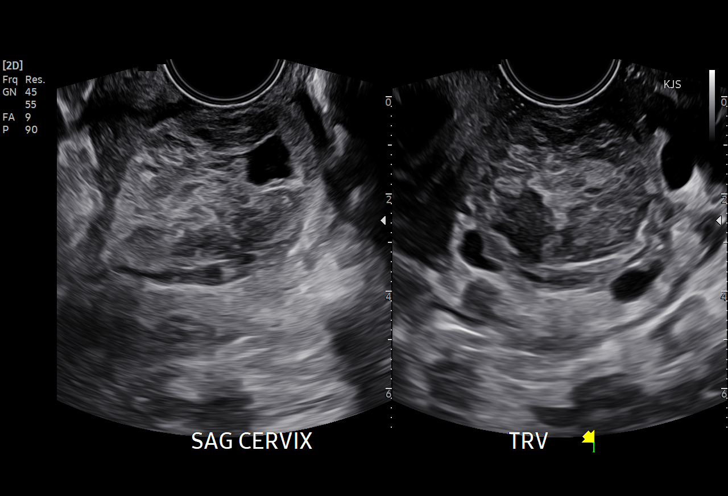
[im 25/57]
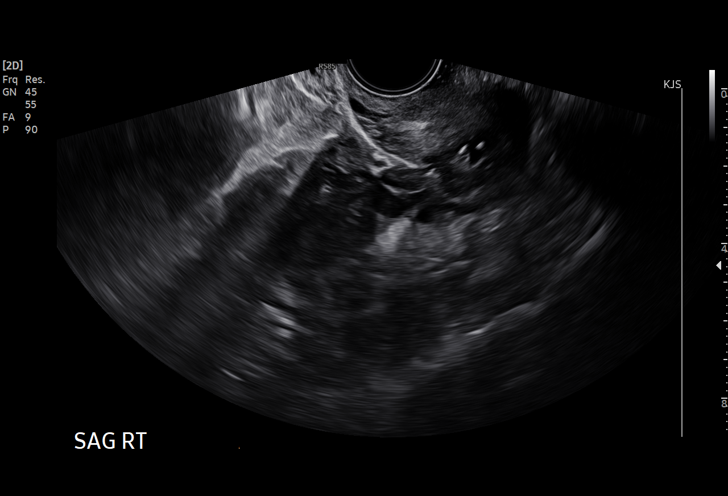
[im 30/57]
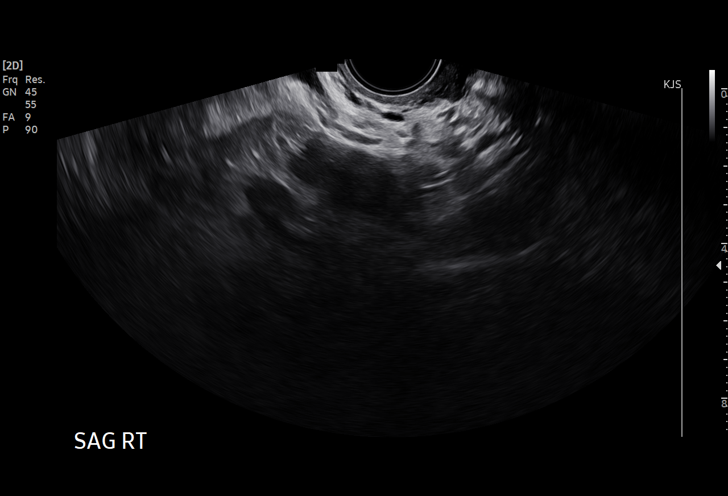
[im 32/57]
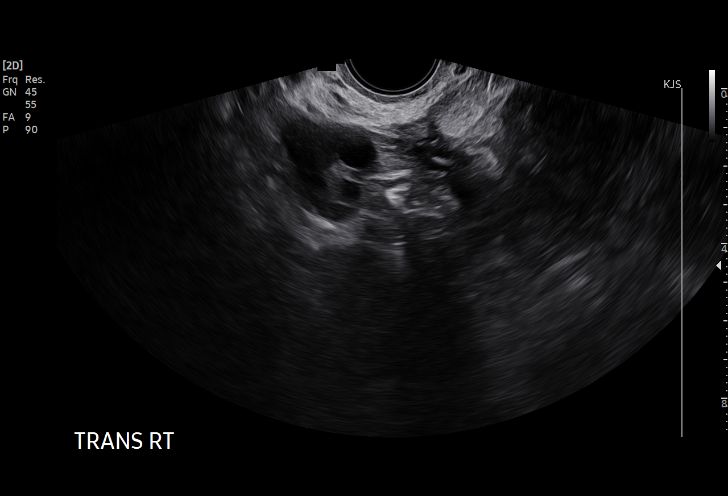
[im 36/57]
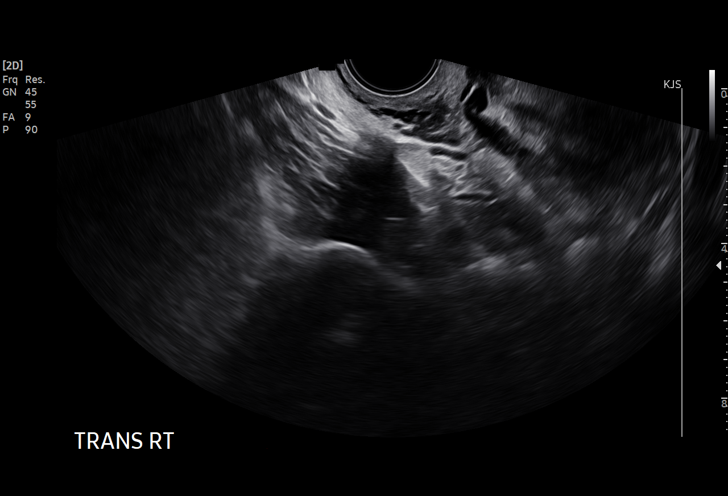
[im 40/57]
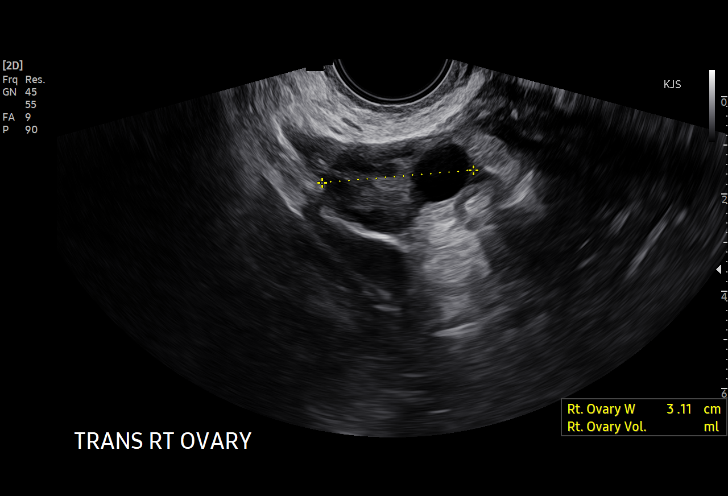
[im 44/57]
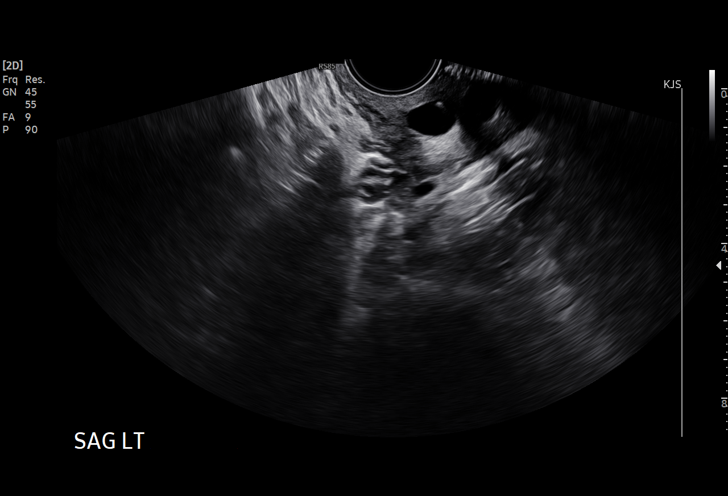
[im 48/57]
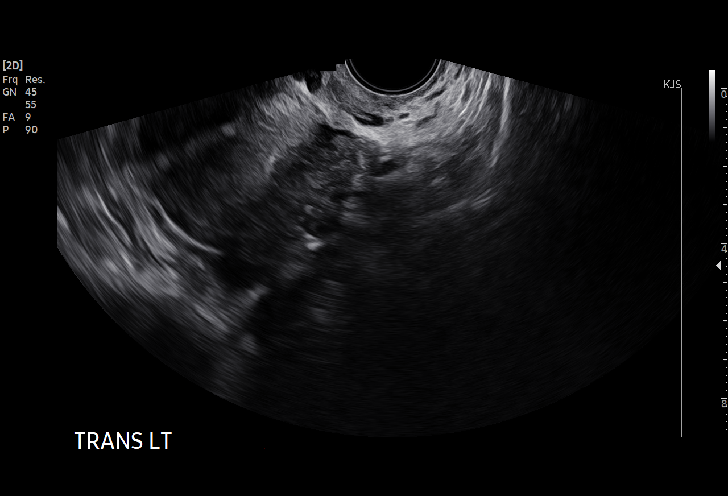
[im 52/57]
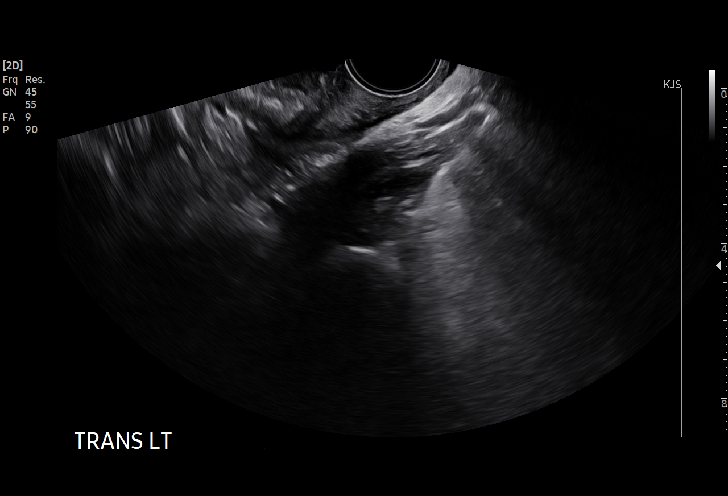
[im 57/57]
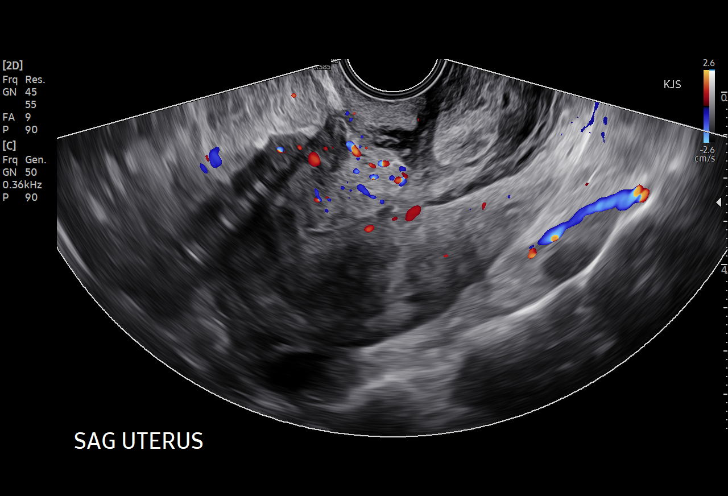

[15 of 28 positions shown; findings below may reference images not displayed]

FINDINGS: Intrauterine gestational sac: None

Maternal uterus/adnexae: No pelvic free fluid.

Bland appearance of the uterine endometrium now. But complex mixed
echogenic mass-like area at the cervix, up to 4.8 cm (image 20) new
since 10/19/2021. No internal vascular elements (image 21).

Right ovary is within normal limits, 3.5 x 2.0 x 3 point 1 cm with
several small follicles. Left ovary not identified today.
IMPRESSION: Highly suspicious for non-viability: Loss of the presumed
intrauterine gestational sac first seen by ultrasound exactly 2
weeks ago, and new complex avascular mass-like area at the cervix
which is probably hematoma. No pelvic free fluid. Right ovary
appears normal, left ovary not visible today.

Above findings are consider definitive for non-viability at > 2
weeks (SRU consensus guidelines: Diagnostic Criteria for Nonviable
Pregnancy Early in the First Trimester. N Engl J Med 4006;
